# Patient Record
Sex: Male | Born: 1965
Health system: Southern US, Community
[De-identification: ages and names within clinical notes are randomized; demographics above are authoritative.]

## PROBLEM LIST (undated history)

## (undated) DIAGNOSIS — S43102S Unspecified dislocation of left acromioclavicular joint, sequela: Secondary | ICD-10-CM

## (undated) DIAGNOSIS — M549 Dorsalgia, unspecified: Secondary | ICD-10-CM

## (undated) DIAGNOSIS — I10 Essential (primary) hypertension: Secondary | ICD-10-CM

## (undated) HISTORY — DX: Essential (primary) hypertension: I10

## (undated) HISTORY — DX: Unspecified dislocation of left acromioclavicular joint, sequela: S43.102S

## (undated) HISTORY — DX: Dorsalgia, unspecified: M54.9

---

## 2003-02-22 ENCOUNTER — Emergency Department (HOSPITAL_COMMUNITY): Admission: EM | Admit: 2003-02-22 | Discharge: 2003-02-22 | Payer: Self-pay | Admitting: Emergency Medicine

## 2005-09-15 ENCOUNTER — Emergency Department (HOSPITAL_COMMUNITY): Admission: EM | Admit: 2005-09-15 | Discharge: 2005-09-15 | Payer: Self-pay | Admitting: Emergency Medicine

## 2006-02-23 ENCOUNTER — Emergency Department (HOSPITAL_COMMUNITY): Admission: EM | Admit: 2006-02-23 | Discharge: 2006-02-23 | Payer: Self-pay | Admitting: Emergency Medicine

## 2007-07-06 ENCOUNTER — Emergency Department (HOSPITAL_COMMUNITY): Admission: EM | Admit: 2007-07-06 | Discharge: 2007-07-06 | Payer: Self-pay | Admitting: Emergency Medicine

## 2007-11-09 ENCOUNTER — Emergency Department (HOSPITAL_COMMUNITY): Admission: EM | Admit: 2007-11-09 | Discharge: 2007-11-09 | Payer: Self-pay | Admitting: Emergency Medicine

## 2010-01-22 ENCOUNTER — Emergency Department (HOSPITAL_COMMUNITY): Admission: EM | Admit: 2010-01-22 | Discharge: 2010-01-22 | Payer: Self-pay | Admitting: Emergency Medicine

## 2010-10-28 LAB — CBC
HCT: 39.8 % (ref 39.0–52.0)
Hemoglobin: 13.6 g/dL (ref 13.0–17.0)
MCHC: 34.3 g/dL (ref 30.0–36.0)
MCV: 94.4 fL (ref 78.0–100.0)
Platelets: 227 10*3/uL (ref 150–400)
RBC: 4.21 MIL/uL — ABNORMAL LOW (ref 4.22–5.81)
RDW: 14 % (ref 11.5–15.5)
WBC: 7.9 10*3/uL (ref 4.0–10.5)

## 2010-10-28 LAB — URINALYSIS, ROUTINE W REFLEX MICROSCOPIC
Bilirubin Urine: NEGATIVE
Glucose, UA: NEGATIVE mg/dL
Hgb urine dipstick: NEGATIVE
Nitrite: NEGATIVE
Protein, ur: NEGATIVE mg/dL
Specific Gravity, Urine: >1.03 — ABNORMAL HIGH (ref 1.005–1.030)
Urobilinogen, UA: 0.2 mg/dL (ref 0.0–1.0)
pH: 5.5 (ref 5.0–8.0)

## 2010-10-28 LAB — COMPREHENSIVE METABOLIC PANEL
ALT: 24 U/L (ref 0–53)
AST: 21 U/L (ref 0–37)
Albumin: 4.1 g/dL (ref 3.5–5.2)
Alkaline Phosphatase: 60 U/L (ref 39–117)
BUN: 9 mg/dL (ref 6–23)
CO2: 28 mEq/L (ref 19–32)
Calcium: 9.1 mg/dL (ref 8.4–10.5)
Chloride: 101 mEq/L (ref 96–112)
Creatinine, Ser: 1.01 mg/dL (ref 0.4–1.5)
GFR calc Af Amer: >60 mL/min (ref >60)
GFR calc non Af Amer: >60 mL/min (ref >60)
Glucose, Bld: 80 mg/dL (ref 70–99)
Potassium: 3.9 mEq/L (ref 3.5–5.1)
Sodium: 139 mEq/L (ref 135–145)
Total Bilirubin: 0.4 mg/dL (ref 0.3–1.2)
Total Protein: 8 g/dL (ref 6.0–8.3)

## 2010-10-28 LAB — DIFFERENTIAL
Basophils Absolute: 0 10*3/uL (ref 0.0–0.1)
Basophils Relative: 0 % (ref 0–1)
Eosinophils Absolute: 0.1 10*3/uL (ref 0.0–0.7)
Eosinophils Relative: 1 % (ref 0–5)
Lymphocytes Relative: 26 % (ref 12–46)
Lymphs Abs: 2.1 10*3/uL (ref 0.7–4.0)
Monocytes Absolute: 0.5 10*3/uL (ref 0.1–1.0)
Monocytes Relative: 7 % (ref 3–12)
Neutro Abs: 5.2 10*3/uL (ref 1.7–7.7)
Neutrophils Relative %: 66 % (ref 43–77)

## 2011-05-20 LAB — I-STAT 8, (EC8 V) (CONVERTED LAB)
Acid-Base Excess: 2
BUN: 15
Bicarbonate: 27.3 — ABNORMAL HIGH
Chloride: 103
Glucose, Bld: 146 — ABNORMAL HIGH
HCT: 52
Hemoglobin: 17.7 — ABNORMAL HIGH
Operator id: 106841
Potassium: 3.7
Sodium: 136
TCO2: 29
pCO2, Ven: 43.5 — ABNORMAL LOW
pH, Ven: 7.406 — ABNORMAL HIGH

## 2011-11-24 ENCOUNTER — Encounter (HOSPITAL_COMMUNITY): Payer: Self-pay

## 2011-11-24 ENCOUNTER — Emergency Department (HOSPITAL_COMMUNITY)
Admission: EM | Admit: 2011-11-24 | Discharge: 2011-11-24 | Disposition: A | Discharge status: Home / Self Care | Payer: Worker's Compensation | Attending: Emergency Medicine | Admitting: Emergency Medicine

## 2011-11-24 ENCOUNTER — Emergency Department (HOSPITAL_COMMUNITY): Payer: Worker's Compensation

## 2011-11-24 DIAGNOSIS — G8929 Other chronic pain: Secondary | ICD-10-CM | POA: Insufficient documentation

## 2011-11-24 DIAGNOSIS — F17200 Nicotine dependence, unspecified, uncomplicated: Secondary | ICD-10-CM | POA: Insufficient documentation

## 2011-11-24 DIAGNOSIS — M25519 Pain in unspecified shoulder: Secondary | ICD-10-CM | POA: Insufficient documentation

## 2011-11-24 MED ORDER — HYDROCODONE-ACETAMINOPHEN 5-325 MG PO TABS: 1.0000 | ORAL_TABLET | ORAL | Status: AC | PRN

## 2011-11-24 MED ORDER — IBUPROFEN 800 MG PO TABS
800.0000 mg | ORAL_TABLET | Freq: Once | ORAL | Status: AC
  Administered 2011-11-24: 800 mg via ORAL
  Filled 2011-11-24: qty 1

## 2011-11-24 MED ORDER — IBUPROFEN 600 MG PO TABS: 600.0000 mg | ORAL_TABLET | Freq: Four times a day (QID) | ORAL | Status: AC | PRN

## 2011-11-24 MED ORDER — HYDROCODONE-ACETAMINOPHEN 5-325 MG PO TABS
1.0000 | ORAL_TABLET | Freq: Once | ORAL | Status: AC
  Administered 2011-11-24: 1 via ORAL
  Filled 2011-11-24: qty 1

## 2011-11-24 NOTE — Discharge Instructions (Signed)
Shoulder Pain The shoulder is a ball and socket joint. The muscles and tendons (rotator cuff) are what keep the shoulder in its joint and stable. This collection of muscles and tendons holds in the head (ball) of the humerus (upper arm bone) in the fossa (cup) of the scapula (shoulder blade). Today no reason was found for your shoulder pain. Often pain in the shoulder may be treated conservatively with temporary immobilization. For example, holding the shoulder in one place using a sling for rest. Physical therapy may be needed if problems continue. HOME CARE INSTRUCTIONS   Apply ice to the sore area for 15 to 20 minutes, 3 to 4 times per day for the first 2 days. Put the ice in a plastic bag. Place a towel between the bag of ice and your skin.   If you have or were given a shoulder sling and straps, do not remove for as long as directed by your caregiver or until you see a caregiver for a follow-up examination. If you need to remove it to shower or bathe, move your arm as little as possible.   Sleep on several pillows at night to lessen swelling and pain.   Only take over-the-counter or prescription medicines for pain, discomfort, or fever as directed by your caregiver.   Keep any follow-up appointments in order to avoid any type of permanent shoulder disability or chronic pain problems.  SEEK MEDICAL CARE IF:   Pain in your shoulder increases or new pain develops in your arm, hand, or fingers.   Your hand or fingers are colder than your other hand.   You do not obtain pain relief with the medications or your pain becomes worse.  SEEK IMMEDIATE MEDICAL CARE IF:   Your arm, hand, or fingers are numb or tingling.   Your arm, hand, or fingers are swollen, painful, or turn white or blue.   You develop chest pain or shortness of breath.  MAKE SURE YOU:   Understand these instructions.   Will watch your condition.   Will get help right away if you are not doing well or get worse.    Document Released: 05/07/2005 Document Revised: 07/17/2011 Document Reviewed: 07/12/2011 [facility] Patient Information 2012 [place], [place].   Use the medicines prescribed for inflammation and for pain.  Use caution with hydrocodone as this medication will make you drowsy, do not drive within 4 hours of taking this.  He may find a heating pad 20 minutes several times per day may help these your pain somewhat.  Please call Dr. Romeo Apple for further management of your chronic shoulder injury.

## 2011-11-24 NOTE — ED Notes (Signed)
MD at bedside. Gregory Marsh at bedside discussing plan of care.

## 2011-11-24 NOTE — ED Notes (Signed)
Pt states pain in left shoulder has been present since 2005 however has increased over past several days. States pain is unbearable at this time. Must "prop shoulder up well sitting for comfort per pt. radial Pulses equal bilateral.
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
## 2011-11-29 NOTE — ED Provider Notes (Signed)
Medical screening examination/treatment/procedure(s) were performed by non-physician practitioner and as supervising physician I was immediately available for consultation/collaboration.  Donnetta Hutching, MD 11/29/11 1558

## 2011-12-09 ENCOUNTER — Encounter: Payer: Self-pay | Admitting: Orthopedic Surgery

## 2011-12-09 ENCOUNTER — Ambulatory Visit (INDEPENDENT_AMBULATORY_CARE_PROVIDER_SITE_OTHER): Payer: Private Health Insurance - Indemnity | Admitting: Orthopedic Surgery

## 2011-12-09 VITALS — BP 110/70 | Ht 72.0 in | Wt 220.0 lb

## 2011-12-09 DIAGNOSIS — S43429A Sprain of unspecified rotator cuff capsule, initial encounter: Secondary | ICD-10-CM

## 2011-12-09 DIAGNOSIS — M25519 Pain in unspecified shoulder: Secondary | ICD-10-CM

## 2011-12-09 DIAGNOSIS — M75100 Unspecified rotator cuff tear or rupture of unspecified shoulder, not specified as traumatic: Secondary | ICD-10-CM

## 2011-12-09 DIAGNOSIS — S43102S Unspecified dislocation of left acromioclavicular joint, sequela: Secondary | ICD-10-CM

## 2011-12-09 NOTE — Patient Instructions (Addendum)
An MRI will be scheduled for you  Stay OOW x 2 weeks

## 2011-12-10 ENCOUNTER — Encounter: Payer: Self-pay | Admitting: Orthopedic Surgery

## 2011-12-10 DIAGNOSIS — S43102S Unspecified dislocation of left acromioclavicular joint, sequela: Secondary | ICD-10-CM

## 2011-12-10 DIAGNOSIS — M75100 Unspecified rotator cuff tear or rupture of unspecified shoulder, not specified as traumatic: Secondary | ICD-10-CM | POA: Insufficient documentation

## 2011-12-10 HISTORY — DX: Unspecified dislocation of left acromioclavicular joint, sequela: S43.102S
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
## 2011-12-16 ENCOUNTER — Telehealth: Payer: Self-pay | Admitting: Radiology

## 2011-12-16 NOTE — Telephone Encounter (Signed)
I called to give the patient his MRI appointment at [facility] on 12-18-11 at 12:45. Patient has Monia Pouch, authorization [phone] and it expires on 03-15-12. Patient will follow up back here for his results.

## 2011-12-17 ENCOUNTER — Other Ambulatory Visit: Payer: Self-pay | Admitting: Family Medicine

## 2011-12-18 ENCOUNTER — Ambulatory Visit (HOSPITAL_COMMUNITY)
Admission: RE | Admit: 2011-12-18 | Discharge: 2011-12-18 | Disposition: A | Discharge status: Home / Self Care | Payer: Private Health Insurance - Indemnity | Source: Ambulatory Visit | Attending: Orthopedic Surgery | Admitting: Orthopedic Surgery

## 2011-12-18 DIAGNOSIS — M249 Joint derangement, unspecified: Secondary | ICD-10-CM | POA: Insufficient documentation

## 2011-12-18 DIAGNOSIS — M719 Bursopathy, unspecified: Secondary | ICD-10-CM | POA: Insufficient documentation

## 2011-12-18 DIAGNOSIS — M75100 Unspecified rotator cuff tear or rupture of unspecified shoulder, not specified as traumatic: Secondary | ICD-10-CM

## 2011-12-18 DIAGNOSIS — M25519 Pain in unspecified shoulder: Secondary | ICD-10-CM | POA: Insufficient documentation

## 2011-12-18 DIAGNOSIS — M67919 Unspecified disorder of synovium and tendon, unspecified shoulder: Secondary | ICD-10-CM | POA: Insufficient documentation

## 2011-12-23 ENCOUNTER — Encounter: Payer: Self-pay | Admitting: Orthopedic Surgery

## 2011-12-23 ENCOUNTER — Ambulatory Visit (INDEPENDENT_AMBULATORY_CARE_PROVIDER_SITE_OTHER): Payer: Private Health Insurance - Indemnity | Admitting: Orthopedic Surgery

## 2011-12-23 VITALS — BP 100/70 | Ht 72.0 in | Wt 220.0 lb

## 2011-12-23 DIAGNOSIS — S43102S Unspecified dislocation of left acromioclavicular joint, sequela: Secondary | ICD-10-CM

## 2011-12-23 DIAGNOSIS — S43429A Sprain of unspecified rotator cuff capsule, initial encounter: Secondary | ICD-10-CM

## 2011-12-23 DIAGNOSIS — M75100 Unspecified rotator cuff tear or rupture of unspecified shoulder, not specified as traumatic: Secondary | ICD-10-CM

## 2011-12-23 NOTE — Patient Instructions (Signed)
You have been scheduled for surgery.  All surgeries carry some risk.  Remember you always have the option of continued nonsurgical treatment. However in this situation the risks vs. the benefits favor surgery as the best treatment option. The risks of the surgery includes the following but is not limited to bleeding, infection, pulmonary embolus, death from anesthesia, nerve injury vascular injury or need for further surgery, continued pain.  Specific to this procedure the following risks and complications are rare but possible Stiffness, pain, weakness, re\re tear.  I expect 9-12 months to fully recover from this procedure.    Rotator Cuff Tear The rotator cuff is four tendons that assist in the motion of the shoulder. A rotator cuff tear is a tear in one of these four tendons. It is characterized by pain and weakness of the shoulder. The rotator cuff tendons surround the shoulder ball and socket joint (humeral head). The rotator cuff tendons attach to the shoulder blade (scapula) on one side and the upper arm bone (humerus) on the other side. The rotator cuff is essential for shoulder stability and shoulder motion. SYMPTOMS    Pain around the shoulder, often at the outer portion of the upper arm.   Pain that is worse with shoulder function, especially when reaching overhead or lifting.   Weakness of the shoulder muscles.   Aching when not using your arm; often, pain awakens you at night, especially when sleeping on the affected side.   Tenderness, swelling, warmth, or redness over the outer aspect of the shoulder.   Loss of strength.   Limited motion of the shoulder, especially reaching behind (reaching into one's back pocket) or across your body.   A crackling sound (crepitation) when moving the shoulder.   Biceps tendon pain (in the front of the shoulder) and inflammation, worse with bending the elbow or lifting.  CAUSES    Strain from sudden increase in amount or intensity of  activity.   Direct blow or injury to the shoulder.   Aging, wear from from normal use.   Roof of the shoulder (acromial) spur.  RISK INCREASES WITH:    Contact sports (football, wrestling, or boxing).   Throwing or hitting sports (baseball, tennis, or volleyball).   Weightlifting and bodybuilding.   Heavy labor.   Previous injury to rotator cuff.   Failure to warm up properly before activity.   Inadequate protective equipment.   Increasing age.   Spurring of the outer end of the scapula (acromion).   Cortisone injections.   Poor shoulder strength and flexibility.  PREVENTION  Warm up and stretch properly before activity.   Allow time for rest and recovery between practices and competition.   Maintain physical fitness:   Cardiovascular fitness.   Shoulder flexibility.   Strength and endurance of the rotator cuff muscles and muscles of the shoulder blade.   Learn and use proper technique when throwing or hitting.  PROGNOSIS Surgery is often needed. Although, symptoms may go away by themselves. RELATED COMPLICATIONS    Persistent pain that may progress to constant pain.   Shoulder stiffness, frozen shoulder syndrome, or loss of motion.   Recurrence of symptoms, especially if treated without surgery.   Inability to return to same level of sports, even with surgery.   Persistent weakness.   Risks of surgery, including infection, bleeding, injury to nerves, shoulder stiffness, weakness, re-tearing of the rotator cuff tendon.   Deltoid detachment, acromial fracture, and persistent pain.  TREATMENT Treatment involves the use of  ice and medicine to reduce pain and inflammation. Strengthening and stretching exercise are usually recommended. These exercises may be completed at home or with a therapist. You may also be instructed to modify offending activities. Corticosteroid injections may be given to reduce inflammation. Surgery is usually recommended for  athletes. Surgery has the best chance for a full recovery. Surgery involves:  Removal of an inflamed bursa.   Removal of an acromial spur if present.   Suturing the torn tendon back together.  Rotator cuff surgeries may be preformed either arthroscopically or through an open incision. Recovery typically takes 6 to 12 months. MEDICATION  If pain medicine is necessary, then nonsteroidal anti-inflammatory medicines, such as aspirin and ibuprofen, or other minor pain relievers, such as acetaminophen, are often recommended.   Do not take pain medicine for 7 days before surgery.   Prescription pain relievers are usually only prescribed after surgery. Use only as directed and only as much as you need.   Corticosteroid injections may be given to reduce inflammation. However, there is a limited number of times the joint may be injected with these medicines.  HEAT AND COLD  Cold treatment (icing) relieves pain and reduces inflammation. Cold treatment should be applied for 10 to 15 minutes every 2 to 3 hours for inflammation and pain and immediately after any activity that aggravates your symptoms. Use ice packs or massage the area with a piece of ice (ice massage).   Heat treatment may be used prior to performing the stretching and strengthening activities prescribed by your caregiver, physical therapist, or athletic trainer. Use a heat pack or soak the injury in warm water.  SEEK MEDICAL CARE IF:    Symptoms get worse or do not improve in 4 to 6 weeks despite treatment.   You experience pain, numbness, or coldness in the hand.   Blue, gray, or dark color appears in the fingernails.   New, unexplained symptoms develop (drugs used in treatment may produce side effects).  Document Released: 07/28/2005 Document Revised: 07/17/2011 Document Reviewed: 11/09/2008 [facility] Patient Information 2012 [place], [place].
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
## 2011-12-26 ENCOUNTER — Telehealth: Payer: Self-pay | Admitting: Orthopedic Surgery

## 2011-12-26 NOTE — Telephone Encounter (Signed)
Contacted insurer [employer], ph# [phone], regarding out-patient surgery scheduled 01/09/12, CPT [phone].  Reached automated voice response system, which indicates no pre-certification for in-network providers for either of these procedure codes. REF# [id number], for [id number], REF # same for [id number].  A fax confirmation also received with this information.

## 2011-12-31 ENCOUNTER — Encounter (HOSPITAL_COMMUNITY): Payer: Self-pay | Admitting: Pharmacy Technician

## 2012-01-01 NOTE — Patient Instructions (Addendum)
20 Gregory Marsh  01/01/2012   Your procedure is scheduled on:  01/09/2012  Report to [facility] at  730  AM.  Call this number if you have problems the morning of surgery: [phone]   Remember:   Do not eat food:After Midnight.  May have clear liquids:until Midnight .    Take these medicines the morning of surgery with A SIP OF WATER:  Zyrtec,norco   Do not wear jewelry, make-up or nail polish.  Do not wear lotions, powders, or perfumes. You may wear deodorant.  Do not shave 48 hours prior to surgery. Men may shave face and neck.  Do not bring valuables to the hospital.  Contacts, dentures or bridgework may not be worn into surgery.  Leave suitcase in the car. After surgery it may be brought to your room.  For patients admitted to the hospital, checkout time is 11:00 AM the day of discharge.   Patients discharged the day of surgery will not be allowed to drive home.  Name and phone number of your driver: family  Special Instructions: CHG Shower Use Special Wash: 1/2 bottle night before surgery and 1/2 bottle morning of surgery.   Please read over the following fact sheets that you were given: Pain Booklet, MRSA Information, Surgical Site Infection Prevention, Anesthesia Post-op Instructions and Care and Recovery After Surgery Shoulder Arthroscopy Because the shoulder is one of the most mobile joints, it is more prone to injury. It is a very shallow ball and socket joint located between the large bone in your upper arm (humerus) and the shoulder blade (scapula). Arthroscopy is a valuable test for evaluating and treating injuries involving the shoulder joint. Arthroscopy is a surgical technique which uses small incisions (cuts by the surgeon) to insert a small telescope like instrument (arthroscope) and other tools into the shoulder. This allows the surgeon to look directly at the problem. When the arthroscope is in the joint, fluid is used to expand the joint space. This allows the surgeon to  examine it more easily. The arthroscope then beams light into the joint and sends an image to a TV screen. As your surgeon examines your shoulder, he or she can also repair a number of problems found at the same time. Sometimes the procedure may change to an open surgery. This would happen if the problems are severe enough that they cannot be corrected with just arthroscopy. This is usually a very safe surgery. Rare complications include damage to nerves or blood vessels, excess bleeding, blood clots, infection, and rarely instrument failure. This is most often performed as a same day surgery. This means you will not have to stay in the hospital overnight. Recovery from this surgery is also much faster than having an open procedure. LET YOUR CAREGIVER KNOW ABOUT:  Allergies.   Medications taken including herbs, eye drops, over the counter medications, and creams.   Use of steroids (by mouth or creams).   Previous problems with anesthetics or novocaine.   Possibility of pregnancy, if this applies.   History of blood clots (thrombophlebitis).   History of bleeding or blood problems.   Previous surgery.   Other health problems.   Family history of anesthetic problems.  BEFORE THE PROCEDURE   Stop all anti-inflammatory medications at least one week before surgery unless instructed otherwise. Tell your surgeon if you have been taking cortisone or other steroids.   Do not eat or drink after midnight or as instructed. Take medications as directed by your caregiver.  You may have lab tests the morning of surgery.   You should be present 60 minutes prior to your procedure or as directed.  PROCEDURE  You may have general (go to sleep) or local (numb the area) anesthetic. Your surgeon will discuss this with you. During the procedure as discussed above, your surgeon may find a variety of problems which he or she can improve or correct using small instruments. When the procedure is finished the tiny  incisions will be closed with stitches or tape. AFTER YOUR PROCEDURE  After surgery you will be taken to the recovery area. A nurse will watch and check your progress. Once you are awake, stable, and taking fluids well, barring other problems you will be allowed to go home.   Once home, apply an ice pack to your operative site for twenty minutes, three to four times per day, for two to three days. This may help with discomfort and keep the swelling down.   Use a sling and medications if prescribed or as instructed.   Unless your caregiver advises otherwise, move your arm and shoulder gently and frequently following the procedure. This can help prevent stiffness and swelling.  REHABILITATION  Almost as important as your surgery is your rehabilitation. If physical therapy and exercises are prescribed by your surgeon, follow them diligently. Once comfortable and on your way to full use, do muscle strengthening exercises as instructed.   Only take over-the-counter or prescription medicines for pain, discomfort, or fever as directed by your caregiver.  SEEK IMMEDIATE MEDICAL CARE IF:   There is redness, swelling, or increasing pain in the wound or joint.   You notice purulent (colored- pus-like) drainage coming from the wound.   An unexplained oral temperature above 102 F (38.9 C) develops.   You notice a foul smell coming from the wound or dressing.   There is a breaking open of the wound. The edges do not stay together after sutures or tape has been removed.   Persistent bleeding from the small incision.  Document Released: 07/25/2000 Document Revised: 07/17/2011 Document Reviewed: 11/13/2008 [facility] Patient Information 2012 [place], [place].PATIENT INSTRUCTIONS POST-ANESTHESIA  IMMEDIATELY FOLLOWING SURGERY:  Do not drive or operate machinery for the first twenty four hours after surgery.  Do not make any important decisions for twenty four hours after surgery or while taking narcotic  pain medications or sedatives.  If you develop intractable nausea and vomiting or a severe headache please notify your doctor immediately.  FOLLOW-UP:  Please make an appointment with your surgeon as instructed. You do not need to follow up with anesthesia unless specifically instructed to do so.  WOUND CARE INSTRUCTIONS (if applicable):  Keep a dry clean dressing on the anesthesia/puncture wound site if there is drainage.  Once the wound has quit draining you may leave it open to air.  Generally you should leave the bandage intact for twenty four hours unless there is drainage.  If the epidural site drains for more than 36-48 hours please call the anesthesia department.  QUESTIONS?:  Please feel free to call your physician or the hospital operator if you have any questions, and they will be happy to assist you.     [facility] Anesthesia Department [address] [place] [phone]

## 2012-01-02 ENCOUNTER — Encounter (HOSPITAL_COMMUNITY)
Admission: RE | Admit: 2012-01-02 | Discharge: 2012-01-02 | Payer: Private Health Insurance - Indemnity | Source: Ambulatory Visit

## 2012-01-07 ENCOUNTER — Encounter (HOSPITAL_COMMUNITY): Payer: Self-pay

## 2012-01-07 ENCOUNTER — Encounter (HOSPITAL_COMMUNITY)
Admission: RE | Admit: 2012-01-07 | Discharge: 2012-01-07 | Payer: Private Health Insurance - Indemnity | Source: Ambulatory Visit | Attending: Orthopedic Surgery | Admitting: Orthopedic Surgery

## 2012-01-07 LAB — BASIC METABOLIC PANEL
BUN: 13 mg/dL (ref 6–23)
CO2: 30 mEq/L (ref 19–32)
Calcium: 9.6 mg/dL (ref 8.4–10.5)
Chloride: 98 mEq/L (ref 96–112)
Creatinine, Ser: 1.21 mg/dL (ref 0.50–1.35)
GFR calc Af Amer: 81 mL/min — ABNORMAL LOW (ref >90)
GFR calc non Af Amer: 70 mL/min — ABNORMAL LOW (ref >90)
Glucose, Bld: 116 mg/dL — ABNORMAL HIGH (ref 70–99)
Potassium: 4.6 mEq/L (ref 3.5–5.1)
Sodium: 137 mEq/L (ref 135–145)

## 2012-01-07 LAB — SURGICAL PCR SCREEN
MRSA, PCR: NEGATIVE
Staphylococcus aureus: NEGATIVE

## 2012-01-07 LAB — HEMOGLOBIN AND HEMATOCRIT, BLOOD
HCT: 40.6 % (ref 39.0–52.0)
Hemoglobin: 13.9 g/dL (ref 13.0–17.0)
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
## 2012-01-09 ENCOUNTER — Ambulatory Visit (HOSPITAL_COMMUNITY)
Admission: RE | Admit: 2012-01-09 | Discharge: 2012-01-09 | Disposition: A | Discharge status: Home / Self Care | Payer: Private Health Insurance - Indemnity | Source: Ambulatory Visit | Attending: Orthopedic Surgery | Admitting: Orthopedic Surgery

## 2012-01-09 ENCOUNTER — Ambulatory Visit (HOSPITAL_COMMUNITY): Payer: Private Health Insurance - Indemnity | Admitting: Anesthesiology

## 2012-01-09 ENCOUNTER — Encounter (HOSPITAL_COMMUNITY)
Admission: RE | Disposition: A | Discharge status: Home / Self Care | Payer: Self-pay | Source: Ambulatory Visit | Attending: Orthopedic Surgery

## 2012-01-09 ENCOUNTER — Encounter (HOSPITAL_COMMUNITY): Payer: Self-pay | Admitting: Anesthesiology

## 2012-01-09 ENCOUNTER — Encounter (HOSPITAL_COMMUNITY): Payer: Self-pay

## 2012-01-09 DIAGNOSIS — Z9889 Other specified postprocedural states: Secondary | ICD-10-CM

## 2012-01-09 DIAGNOSIS — M24119 Other articular cartilage disorders, unspecified shoulder: Secondary | ICD-10-CM | POA: Diagnosis present

## 2012-01-09 DIAGNOSIS — M67919 Unspecified disorder of synovium and tendon, unspecified shoulder: Secondary | ICD-10-CM | POA: Insufficient documentation

## 2012-01-09 DIAGNOSIS — M719 Bursopathy, unspecified: Secondary | ICD-10-CM | POA: Insufficient documentation

## 2012-01-09 DIAGNOSIS — M249 Joint derangement, unspecified: Secondary | ICD-10-CM | POA: Insufficient documentation

## 2012-01-09 DIAGNOSIS — M19019 Primary osteoarthritis, unspecified shoulder: Secondary | ICD-10-CM | POA: Insufficient documentation

## 2012-01-09 DIAGNOSIS — S43102S Unspecified dislocation of left acromioclavicular joint, sequela: Secondary | ICD-10-CM

## 2012-01-09 HISTORY — PX: SHOULDER OPEN ROTATOR CUFF REPAIR: SHX2407

## 2012-01-09 HISTORY — PX: SHOULDER ARTHROSCOPY: SHX128

## 2012-01-09 SURGERY — REPAIR, ROTATOR CUFF, OPEN
Anesthesia: General | Site: Shoulder | Laterality: Left | Wound class: Clean

## 2012-01-09 MED ORDER — METHOCARBAMOL 500 MG PO TABS: 500.0000 mg | ORAL_TABLET | Freq: Four times a day (QID) | ORAL | Status: AC

## 2012-01-09 MED ORDER — CHLORHEXIDINE GLUCONATE 4 % EX LIQD
60.0000 mL | Freq: Once | CUTANEOUS | Status: DC
  Filled 2012-01-09: qty 60

## 2012-01-09 MED ORDER — GLYCOPYRROLATE 0.2 MG/ML IJ SOLN
0.2000 mg | Freq: Once | INTRAMUSCULAR | Status: AC
  Administered 2012-01-09: 0.2 mg via INTRAVENOUS

## 2012-01-09 MED ORDER — MIDAZOLAM HCL 5 MG/5ML IJ SOLN
INTRAMUSCULAR | Status: DC | PRN
  Administered 2012-01-09: 2 mg via INTRAVENOUS

## 2012-01-09 MED ORDER — PREGABALIN 50 MG PO CAPS
50.0000 mg | ORAL_CAPSULE | Freq: Three times a day (TID) | ORAL | Status: DC
  Administered 2012-01-09: 50 mg via ORAL

## 2012-01-09 MED ORDER — ONDANSETRON HCL 4 MG/2ML IJ SOLN
4.0000 mg | Freq: Once | INTRAMUSCULAR | Status: AC
  Administered 2012-01-09: 4 mg via INTRAVENOUS

## 2012-01-09 MED ORDER — NEOSTIGMINE METHYLSULFATE 1 MG/ML IJ SOLN
INTRAMUSCULAR | Status: DC | PRN
  Administered 2012-01-09: 4 mg via INTRAVENOUS

## 2012-01-09 MED ORDER — NEOSTIGMINE METHYLSULFATE 1 MG/ML IJ SOLN
INTRAMUSCULAR | Status: AC
  Filled 2012-01-09: qty 10

## 2012-01-09 MED ORDER — GLYCOPYRROLATE 0.2 MG/ML IJ SOLN
INTRAMUSCULAR | Status: AC
  Filled 2012-01-09: qty 3

## 2012-01-09 MED ORDER — ACETAMINOPHEN 10 MG/ML IV SOLN
1000.0000 mg | Freq: Once | INTRAVENOUS | Status: AC
  Administered 2012-01-09: 1000 mg via INTRAVENOUS

## 2012-01-09 MED ORDER — FENTANYL CITRATE 0.05 MG/ML IJ SOLN
INTRAMUSCULAR | Status: DC | PRN
  Administered 2012-01-09 (×2): 50 ug via INTRAVENOUS
  Administered 2012-01-09: 150 ug via INTRAVENOUS
  Administered 2012-01-09: 50 ug via INTRAVENOUS
  Administered 2012-01-09: 100 ug via INTRAVENOUS
  Administered 2012-01-09: 50 ug via INTRAVENOUS

## 2012-01-09 MED ORDER — GLYCOPYRROLATE 0.2 MG/ML IJ SOLN
INTRAMUSCULAR | Status: AC
  Administered 2012-01-09: 0.2 mg via INTRAVENOUS
  Filled 2012-01-09: qty 1

## 2012-01-09 MED ORDER — CEFAZOLIN SODIUM-DEXTROSE 2-3 GM-% IV SOLR
INTRAVENOUS | Status: AC
  Filled 2012-01-09: qty 50

## 2012-01-09 MED ORDER — ROCURONIUM BROMIDE 50 MG/5ML IV SOLN
INTRAVENOUS | Status: AC
  Filled 2012-01-09: qty 1

## 2012-01-09 MED ORDER — PREGABALIN 50 MG PO CAPS
ORAL_CAPSULE | ORAL | Status: AC
  Administered 2012-01-09: 50 mg via ORAL
  Filled 2012-01-09: qty 1

## 2012-01-09 MED ORDER — MIDAZOLAM HCL 2 MG/2ML IJ SOLN
INTRAMUSCULAR | Status: AC
  Filled 2012-01-09: qty 2

## 2012-01-09 MED ORDER — SODIUM CHLORIDE 0.9 % IR SOLN
Status: DC | PRN
  Administered 2012-01-09 (×5)

## 2012-01-09 MED ORDER — BUPIVACAINE-EPINEPHRINE 0.5% -1:200000 IJ SOLN
INTRAMUSCULAR | Status: DC | PRN
  Administered 2012-01-09: 85 mL

## 2012-01-09 MED ORDER — BUPIVACAINE-EPINEPHRINE PF 0.5-1:200000 % IJ SOLN
INTRAMUSCULAR | Status: AC
  Filled 2012-01-09: qty 20

## 2012-01-09 MED ORDER — FENTANYL CITRATE 0.05 MG/ML IJ SOLN
INTRAMUSCULAR | Status: AC
  Filled 2012-01-09: qty 2

## 2012-01-09 MED ORDER — MIDAZOLAM HCL 2 MG/2ML IJ SOLN
1.0000 mg | INTRAMUSCULAR | Status: DC | PRN
  Administered 2012-01-09 (×2): 2 mg via INTRAVENOUS

## 2012-01-09 MED ORDER — ONDANSETRON HCL 4 MG/2ML IJ SOLN
INTRAMUSCULAR | Status: AC
  Administered 2012-01-09: 4 mg via INTRAVENOUS
  Filled 2012-01-09: qty 2

## 2012-01-09 MED ORDER — ROCURONIUM BROMIDE 100 MG/10ML IV SOLN
INTRAVENOUS | Status: DC | PRN
  Administered 2012-01-09: 10 mg via INTRAVENOUS
  Administered 2012-01-09: 40 mg via INTRAVENOUS

## 2012-01-09 MED ORDER — OXYCODONE HCL 5 MG PO TABS
ORAL_TABLET | ORAL | Status: AC
  Administered 2012-01-09: 5 mg via ORAL
  Filled 2012-01-09: qty 1

## 2012-01-09 MED ORDER — LACTATED RINGERS IV SOLN
INTRAVENOUS | Status: DC | PRN
  Administered 2012-01-09 (×2): via INTRAVENOUS

## 2012-01-09 MED ORDER — LACTATED RINGERS IV SOLN
INTRAVENOUS | Status: DC
  Administered 2012-01-09: 09:00:00 via INTRAVENOUS

## 2012-01-09 MED ORDER — OXYCODONE HCL 5 MG PO TABS
5.0000 mg | ORAL_TABLET | ORAL | Status: DC
  Administered 2012-01-09: 5 mg via ORAL

## 2012-01-09 MED ORDER — ONDANSETRON HCL 4 MG/2ML IJ SOLN: 4.0000 mg | Freq: Once | INTRAMUSCULAR | Status: DC | PRN

## 2012-01-09 MED ORDER — FENTANYL CITRATE 0.05 MG/ML IJ SOLN: 25.0000 ug | INTRAMUSCULAR | Status: DC | PRN

## 2012-01-09 MED ORDER — CELECOXIB 100 MG PO CAPS
400.0000 mg | ORAL_CAPSULE | Freq: Once | ORAL | Status: AC
  Administered 2012-01-09: 400 mg via ORAL

## 2012-01-09 MED ORDER — CEFAZOLIN SODIUM-DEXTROSE 2-3 GM-% IV SOLR: 2.0000 g | INTRAVENOUS | Status: DC

## 2012-01-09 MED ORDER — PROMETHAZINE HCL 12.5 MG PO TABS: 12.5000 mg | ORAL_TABLET | Freq: Four times a day (QID) | ORAL | Status: DC | PRN

## 2012-01-09 MED ORDER — LIDOCAINE HCL (PF) 1 % IJ SOLN
INTRAMUSCULAR | Status: AC
  Filled 2012-01-09: qty 5

## 2012-01-09 MED ORDER — GLYCOPYRROLATE 0.2 MG/ML IJ SOLN
INTRAMUSCULAR | Status: DC | PRN
  Administered 2012-01-09: 0.6 mg via INTRAVENOUS

## 2012-01-09 MED ORDER — KETOROLAC TROMETHAMINE 30 MG/ML IJ SOLN
INTRAMUSCULAR | Status: AC
  Administered 2012-01-09: 30 mg via INTRAVENOUS
  Filled 2012-01-09: qty 1

## 2012-01-09 MED ORDER — ACETAMINOPHEN 10 MG/ML IV SOLN
INTRAVENOUS | Status: AC
  Administered 2012-01-09: 1000 mg via INTRAVENOUS
  Filled 2012-01-09: qty 100

## 2012-01-09 MED ORDER — KETOROLAC TROMETHAMINE 30 MG/ML IJ SOLN
30.0000 mg | Freq: Once | INTRAMUSCULAR | Status: AC
  Administered 2012-01-09: 30 mg via INTRAVENOUS

## 2012-01-09 MED ORDER — BUPIVACAINE-EPINEPHRINE PF 0.5-1:200000 % IJ SOLN
INTRAMUSCULAR | Status: AC
  Filled 2012-01-09: qty 10

## 2012-01-09 MED ORDER — PROPOFOL 10 MG/ML IV EMUL
INTRAVENOUS | Status: DC | PRN
  Administered 2012-01-09: 180 mg via INTRAVENOUS

## 2012-01-09 MED ORDER — SODIUM CHLORIDE 0.9 % IR SOLN
Status: DC | PRN
  Administered 2012-01-09: 1000 mL

## 2012-01-09 MED ORDER — PROPOFOL 10 MG/ML IV EMUL
INTRAVENOUS | Status: AC
  Filled 2012-01-09: qty 20

## 2012-01-09 MED ORDER — HEMOSTATIC AGENTS (NO CHARGE) OPTIME
TOPICAL | Status: DC | PRN
  Administered 2012-01-09: 1 via TOPICAL

## 2012-01-09 MED ORDER — LIDOCAINE HCL (CARDIAC) 10 MG/ML IV SOLN
INTRAVENOUS | Status: DC | PRN
  Administered 2012-01-09: 50 mg via INTRAVENOUS

## 2012-01-09 MED ORDER — CELECOXIB 100 MG PO CAPS
ORAL_CAPSULE | ORAL | Status: AC
  Administered 2012-01-09: 400 mg via ORAL
  Filled 2012-01-09: qty 4

## 2012-01-09 MED ORDER — FENTANYL CITRATE 0.05 MG/ML IJ SOLN
INTRAMUSCULAR | Status: AC
  Filled 2012-01-09: qty 5

## 2012-01-09 MED ORDER — MIDAZOLAM HCL 2 MG/2ML IJ SOLN
INTRAMUSCULAR | Status: AC
  Administered 2012-01-09: 2 mg via INTRAVENOUS
  Filled 2012-01-09: qty 2

## 2012-01-09 MED ORDER — EPINEPHRINE HCL 1 MG/ML IJ SOLN
INTRAMUSCULAR | Status: AC
  Filled 2012-01-09: qty 10

## 2012-01-09 MED ORDER — ACETAMINOPHEN 325 MG PO TABS: 325.0000 mg | ORAL_TABLET | ORAL | Status: DC | PRN

## 2012-01-09 MED ORDER — IBUPROFEN 800 MG PO TABS: 800.0000 mg | ORAL_TABLET | Freq: Three times a day (TID) | ORAL | Status: AC | PRN

## 2012-01-09 MED ORDER — OXYCODONE-ACETAMINOPHEN 5-325 MG PO TABS: 1.0000 | ORAL_TABLET | ORAL | Status: AC | PRN

## 2012-01-09 MED ORDER — CEFAZOLIN SODIUM 1-5 GM-% IV SOLN
INTRAVENOUS | Status: DC | PRN
  Administered 2012-01-09: 2 g via INTRAVENOUS

## 2012-01-09 MED ORDER — METHOCARBAMOL 100 MG/ML IJ SOLN
500.0000 mg | Freq: Once | INTRAVENOUS | Status: AC
  Administered 2012-01-09: 500 mg via INTRAVENOUS
  Filled 2012-01-09: qty 5

## 2012-01-09 NOTE — Op Note (Signed)
01/09/2012  11:33 AM  PATIENT:  Gregory Marsh  46 y.o. male  PRE-OPERATIVE DIAGNOSIS:  torn rotator cuff, symptomatic ac joint arthrosis left shoulder  POST-OPERATIVE DIAGNOSIS:   1. OA LEFT SHOULDER AC JOINT  2. ANTERIOR LABRAL TEAR   3. PARTIAL SUBSCAPULARIS TEAR   4. OS ACROMIALE  5. ROTATOR CUFF TENDONOSIS   PROCEDURE:  Procedure(s) (LRB): 1. LEFT SHOULDER ARTHROSCOPY DEBRIDEMENT LIMITED 16109 2. ARTHROSCOPIC ACROMIOPLASTY 29826 3. OPEN DISTAL CLAVICLE 23120  FINDINGS:  Degenerative tear anterior labrum  Partial tear subscapularis intra-articular  Os acromiale, fibrous union   Osteoarthritis acromioclavicular joint  Details the surgical procedure  The patient was identified in the preop holding and the left shoulder was identified as a surgical site marked the chart was reviewed and the consent was signed. The patient was taken back to the operating room he was given appropriate antibiotics IV for his the supine position and general intubation and then placed in the modified beachchair position the left lobe showed was prepped and draped sterilely. Timeout procedure was executed. A posterior portal was established the joint was entered. A diagnostic arthroscopy was performed. In the joint we saw that he had no evidence of a rotator cuff tear but he did have a degenerative anteriorly tear. It was felt that he was not symptomatic from this and was stable in abduction external rotation position and therefore this was gently debrided.  Further debridement was performed around the subscapularis.  The scope was then placed in the subacromial space a bursectomy was performed. He also had a fibrous nonunion of an os acromiale and this was taken down with a motorized bur including the deltoid attachment intact.  The acromioclavicular joint was stenotic with a large inferior impinging osteophyte on the rotator cuff which was diseased and felt to have severe tendinosis.  The scope was  removed from the joint and the a.c. joint was opened. This was done in the following manner. An incision was made in line with the a.c. joint subcutaneous tissue was divided down to the periosteum a transverse incision was made creating a periosteal sleeve with anterior posterior flaps the bulky distal clavicle with its large inferior osteophyte was removed and an oscillating saw and an osteotome. Hemostasis was obtained using cautery, Surgicel and bone wax. After thorough irrigation the periosteal sleeve was closed with 0 Monocryl in running fashion. Subcuticular stitch was placed using 2-0 Monocryl and skin was reapproximated with staples. The periosteum and subacromial space were injected with Marcaine and epinephrine solution followed by application of sterile dressings and a shoulder immobilizer    SURGEON:  Surgeon(s) and Role:    * Vickki Hearing, MD - Primary  PHYSICIAN ASSISTANT:   ASSISTANTS: nicole small    ANESTHESIA:   general  EBL:  Total I/O In: 1000 [I.V.:1000] Out: -   BLOOD ADMINISTERED:none  DRAINS: none   LOCAL MEDICATIONS USED:  MARCAINE   , Amount: 85 ml and OTHER + epi  SPECIMEN:  No Specimen  DISPOSITION OF SPECIMEN:  N/A  COUNTS:  YES  TOURNIQUET:  * No tourniquets in log *  DICTATION: .Dragon Dictation  PLAN OF CARE: Discharge to home after PACU  PATIENT DISPOSITION:  PACU - hemodynamically stable.   Delay start of Pharmacological VTE agent (>24hrs) due to surgical blood loss or risk of bleeding: not applicable

## 2012-01-09 NOTE — Anesthesia Postprocedure Evaluation (Signed)
  Anesthesia Post-op Note  Patient: Gregory Marsh  Procedure(s) Performed: Procedure(s) (LRB): ROTATOR CUFF REPAIR SHOULDER OPEN (Left) ARTHROSCOPY SHOULDER (Left) SHOULDER ACROMIOPLASTY (Left)  Patient Location: PACU  Anesthesia Type: General  Level of Consciousness: awake, alert , oriented and patient cooperative  Airway and Oxygen Therapy: Patient Spontanous Breathing and Patient connected to face mask oxygen  Post-op Pain: mild  Post-op Assessment: Post-op Vital signs reviewed, Patient's Cardiovascular Status Stable, Respiratory Function Stable, Patent Airway and No signs of Nausea or vomiting  Post-op Vital Signs: Reviewed and stable  Complications: No apparent anesthesia complications

## 2012-01-09 NOTE — Anesthesia Preprocedure Evaluation (Signed)
Anesthesia Evaluation  Patient identified by MRN, date of birth, ID band Patient awake    Reviewed: Allergy & Precautions, H&P , NPO status , Patient's Chart, lab work & pertinent test results  Airway Mallampati: I TM Distance: >3 FB Neck ROM: Full    Dental No notable dental hx.    Pulmonary neg pulmonary ROS,    Pulmonary exam normal       Cardiovascular negative cardio ROS  Rhythm:Regular Rate:Normal     Neuro/Psych  Neuromuscular disease negative psych ROS   GI/Hepatic negative GI ROS, Neg liver ROS,   Endo/Other  negative endocrine ROS  Renal/GU negative Renal ROS     Musculoskeletal negative musculoskeletal ROS (+)   Abdominal Normal abdominal exam  (+)   Peds  Hematology negative hematology ROS (+)   Anesthesia Other Findings   Reproductive/Obstetrics                           Anesthesia Physical Anesthesia Plan  ASA: I  Anesthesia Plan: General   Post-op Pain Management:    Induction: Intravenous  Airway Management Planned: Oral ETT  Additional Equipment:   Intra-op Plan:   Post-operative Plan: Extubation in OR  Informed Consent: I have reviewed the patients History and Physical, chart, labs and discussed the procedure including the risks, benefits and alternatives for the proposed anesthesia with the patient or authorized representative who has indicated his/her understanding and acceptance.     Plan Discussed with: CRNA  Anesthesia Plan Comments:         Anesthesia Quick Evaluation

## 2012-01-09 NOTE — Anesthesia Procedure Notes (Signed)
Procedure Name: Intubation Date/Time: 01/09/2012 9:34 AM Performed by: Carolyne Littles, Keyaria Lawson L Pre-anesthesia Checklist: Patient identified, Patient being monitored, Timeout performed, Emergency Drugs available and Suction available Patient Re-evaluated:Patient Re-evaluated prior to inductionOxygen Delivery Method: Circle System Utilized Preoxygenation: Pre-oxygenation with 100% oxygen Intubation Type: IV induction Ventilation: Mask ventilation without difficulty Laryngoscope Size: 3 and Miller Grade View: Grade I Tube type: Oral Tube size: 7.0 mm Number of attempts: 1 Airway Equipment and Method: stylet Placement Confirmation: ETT inserted through vocal cords under direct vision,  positive ETCO2 and breath sounds checked- equal and bilateral Secured at: 21 cm Tube secured with: Tape Dental Injury: Teeth and Oropharynx as per pre-operative assessment

## 2012-01-09 NOTE — Interval H&P Note (Signed)
History and Physical Interval Note:  01/09/2012 9:14 AM  Gregory Marsh  has presented today for surgery, with the diagnosis of torn rotator cuff, symptomatic ac joint arthrosis left shoulder  The various methods of treatment have been discussed with the patient and family. After consideration of risks, benefits and other options for treatment, the patient has consented to  Procedure(s) (LRB):LEFT SHOULDER ARTHROSCOPY WITH OPEN ROTATOR CUFF REPAIR AND DISTAL CLAVICLE ACROMINECTOMY (Left) as a surgical intervention .  The patients' history has been reviewed, patient examined, no change in status, stable for surgery.  I have reviewed the patients' chart and labs.  Questions were answered to the patient's satisfaction.     Fuller Canada

## 2012-01-09 NOTE — Transfer of Care (Signed)
Immediate Anesthesia Transfer of Care Note  Patient: Gregory Marsh  Procedure(s) Performed: Procedure(s) (LRB): SHOULDER ARTHROSCOPY WITH OPEN ROTATOR CUFF REPAIR AND DISTAL CLAVICLE ACROMINECTOMY (Left) ROTATOR CUFF REPAIR SHOULDER OPEN (Left) ARTHROSCOPY SHOULDER (Left)  Patient Location: PACU  Anesthesia Type: General  Level of Consciousness: awake, oriented and patient cooperative  Airway & Oxygen Therapy: Patient Spontanous Breathing and Patient connected to face mask oxygen  Post-op Assessment: Report given to PACU RN and Post -op Vital signs reviewed and stable  Post vital signs: Reviewed and stable  Complications: No apparent anesthesia complications

## 2012-01-09 NOTE — Preoperative (Signed)
Beta Blockers   Reason not to administer Beta Blockers:Not Applicable 

## 2012-01-09 NOTE — Brief Op Note (Addendum)
01/09/2012  11:33 AM  PATIENT:  Gregory Marsh  46 y.o. male  PRE-OPERATIVE DIAGNOSIS:  torn rotator cuff, symptomatic ac joint arthrosis left shoulder  POST-OPERATIVE DIAGNOSIS:   1. OA LEFT SHOULDER AC JOINT  2. ANTERIOR LABRAL TEAR   3. PARTIAL SUBSCAPULARIS TEAR   4. OS ACROMIALE  5. ROTATOR CUFF TENDONOSIS   PROCEDURE:  Procedure(s) (LRB): 1. LEFT SHOULDER ARTHROSCOPY DEBRIDEMENT LIMITED 84696 2. ARTHROSCOPIC ACROMIOPLASTY 29826 3. OPEN DISTAL CLAVICLE 23120  FINDINGS:  Degenerative tear anterior labrum  Partial tear subscapularis intra-articular  Os acromiale, fibrous union   Osteoarthritis acromioclavicular joint  SURGEON:  Surgeon(s) and Role:    * Vickki Hearing, MD - Primary  PHYSICIAN ASSISTANT:   ASSISTANTS: nicole small    ANESTHESIA:   general  EBL:  Total I/O In: 1000 [I.V.:1000] Out: -   BLOOD ADMINISTERED:none  DRAINS: none   LOCAL MEDICATIONS USED:  MARCAINE   , Amount: 85 ml and OTHER + epi  SPECIMEN:  No Specimen  DISPOSITION OF SPECIMEN:  N/A  COUNTS:  YES  TOURNIQUET:  * No tourniquets in log *  DICTATION: .Dragon Dictation  PLAN OF CARE: Discharge to home after PACU  PATIENT DISPOSITION:  PACU - hemodynamically stable.   Delay start of Pharmacological VTE agent (>24hrs) due to surgical blood loss or risk of bleeding: not applicable

## 2012-01-12 ENCOUNTER — Ambulatory Visit (INDEPENDENT_AMBULATORY_CARE_PROVIDER_SITE_OTHER): Payer: Private Health Insurance - Indemnity | Admitting: Orthopedic Surgery

## 2012-01-12 ENCOUNTER — Encounter: Payer: Self-pay | Admitting: Orthopedic Surgery

## 2012-01-12 VITALS — BP 140/70 | Ht 72.0 in | Wt 220.0 lb

## 2012-01-12 DIAGNOSIS — Z9889 Other specified postprocedural states: Secondary | ICD-10-CM | POA: Insufficient documentation

## 2012-01-12 NOTE — Patient Instructions (Addendum)
Shoulder brace x  3 weeks  Start Physical therapy in 1 week

## 2012-01-12 NOTE — Progress Notes (Signed)
Patient ID: Gregory Marsh, male   DOB: 02-24-1966, 46 y.o.   MRN: [id number] Chief Complaint  Patient presents with  . Routine Post Op    post op 1 left shoulder, DOS 01/09/12    BP 140/70  Ht 6' (1.829 m)  Wt 220 lb (99.791 kg)  BMI 29.84 kg/m2  PRE-OPERATIVE DIAGNOSIS: torn rotator cuff, symptomatic ac joint arthrosis left shoulder  POST-OPERATIVE DIAGNOSIS:  1. OA LEFT SHOULDER AC JOINT  2. ANTERIOR LABRAL TEAR  3. PARTIAL SUBSCAPULARIS TEAR  4. OS ACROMIALE  5. ROTATOR CUFF TENDONOSIS  PROCEDURE: Procedure(s) (LRB):  1. LEFT SHOULDER ARTHROSCOPY DEBRIDEMENT LIMITED 78295  2. ARTHROSCOPIC ACROMIOPLASTY 29826  3. OPEN DISTAL CLAVICLE 23120  FINDINGS:  Degenerative tear anterior labrum  Partial tear subscapularis intra-articular  Os acromiale, fibrous union  Osteoarthritis acromioclavicular joint  Postop visit #1 postop day #3.  His incisions look clean his neurovascular exam is normal he is placed in a sling shot and can start therapy in a week, remove staples in a week

## 2012-01-13 ENCOUNTER — Encounter (HOSPITAL_COMMUNITY): Payer: Self-pay | Admitting: Orthopedic Surgery

## 2012-01-13 DIAGNOSIS — M19019 Primary osteoarthritis, unspecified shoulder: Secondary | ICD-10-CM | POA: Diagnosis present

## 2012-01-13 DIAGNOSIS — M719 Bursopathy, unspecified: Secondary | ICD-10-CM | POA: Diagnosis present

## 2012-01-13 DIAGNOSIS — M67919 Unspecified disorder of synovium and tendon, unspecified shoulder: Secondary | ICD-10-CM | POA: Diagnosis present

## 2012-01-13 DIAGNOSIS — M24119 Other articular cartilage disorders, unspecified shoulder: Secondary | ICD-10-CM | POA: Diagnosis present

## 2012-01-13 HISTORY — DX: Other articular cartilage disorders, unspecified shoulder: M24.119

## 2012-01-13 HISTORY — DX: Primary osteoarthritis, unspecified shoulder: M19.019

## 2012-01-20 ENCOUNTER — Encounter: Payer: Self-pay | Admitting: Orthopedic Surgery

## 2012-01-20 ENCOUNTER — Ambulatory Visit (INDEPENDENT_AMBULATORY_CARE_PROVIDER_SITE_OTHER): Payer: Private Health Insurance - Indemnity | Admitting: Orthopedic Surgery

## 2012-01-20 ENCOUNTER — Telehealth: Payer: Self-pay | Admitting: Orthopedic Surgery

## 2012-01-20 VITALS — BP 126/82 | Ht 72.0 in | Wt 220.0 lb

## 2012-01-20 DIAGNOSIS — S43109A Unspecified dislocation of unspecified acromioclavicular joint, initial encounter: Secondary | ICD-10-CM

## 2012-01-20 DIAGNOSIS — M24119 Other articular cartilage disorders, unspecified shoulder: Secondary | ICD-10-CM

## 2012-01-20 NOTE — Patient Instructions (Signed)
He has not started please call for therapy department to arrange her 1st physical therapy visits.  Use sling when up in public otherwise, it can be removed

## 2012-01-20 NOTE — Progress Notes (Signed)
Patient ID: Gregory Marsh, male   DOB: 1966/07/27, 46 y.o.   MRN: [id number] Chief Complaint  Patient presents with  . Routine Post Op    post op 2, remove staples Left shoulder, DOS 01/09/12    BP 126/82  Ht 6' (1.829 m)  Wt 220 lb (99.791 kg)  BMI 29.84 kg/m2   would check suture removal. Today, from 8 arthroscopic decompression and open distal clavicle excision, LEFT shoulder. His pain is much better. His swelling has gone down. He can start therapy this week and use his sling when out of public otherwise, he can take it off.  Followup 4 weeks.

## 2012-01-20 NOTE — Telephone Encounter (Signed)
Updated medical office notes,work note faxed to [employer] # [phone], Ph #[phone], per patient and disability insurance request, to attention: Armandina Stammer; authorization on file.

## 2012-01-22 ENCOUNTER — Inpatient Hospital Stay (HOSPITAL_COMMUNITY)
Admission: RE | Admit: 2012-01-22 | Payer: Private Health Insurance - Indemnity | Source: Ambulatory Visit | Admitting: Specialist

## 2012-01-26 ENCOUNTER — Ambulatory Visit (HOSPITAL_COMMUNITY)
Admission: RE | Admit: 2012-01-26 | Discharge: 2012-01-26 | Disposition: A | Discharge status: Home / Self Care | Payer: Private Health Insurance - Indemnity | Source: Ambulatory Visit | Attending: Orthopedic Surgery | Admitting: Orthopedic Surgery

## 2012-01-26 DIAGNOSIS — M25519 Pain in unspecified shoulder: Secondary | ICD-10-CM | POA: Insufficient documentation

## 2012-01-26 DIAGNOSIS — IMO0001 Reserved for inherently not codable concepts without codable children: Secondary | ICD-10-CM | POA: Insufficient documentation

## 2012-01-26 DIAGNOSIS — M25619 Stiffness of unspecified shoulder, not elsewhere classified: Secondary | ICD-10-CM | POA: Insufficient documentation

## 2012-01-26 DIAGNOSIS — M6281 Muscle weakness (generalized): Secondary | ICD-10-CM | POA: Insufficient documentation

## 2012-01-26 NOTE — Evaluation (Signed)
Occupational Therapy Evaluation  Patient Details  Name: Gregory Marsh MRN: [id number] Date of Birth: Jan 09, 1966  Today's Date: 01/26/2012 Time: 1030-1120 OT Time Calculation (min): 50 min OT Evaluation 1030-1045 15' Manual Therapy 1045-1100 15' Ice 1105-1120 15' Visit#: 1  of 36   Re-eval: 02/23/12  Assessment Diagnosis: S/P Left SAD and Arthroscopic Acromioplasty Surgical Date: 01/09/12 Next MD Visit: 0709/13 Prior Therapy: N/A  Past Medical History: No past medical history on file. Past Surgical History:  Past Surgical History  Procedure Date  . Shoulder open rotator cuff repair 01/09/2012    Procedure: ROTATOR CUFF REPAIR SHOULDER OPEN;  Surgeon: Vickki Hearing, MD;  Location: [facility];  Service: Orthopedics;  Laterality: Left;  with distal clavicle excision  . Shoulder arthroscopy 01/09/2012    Procedure: ARTHROSCOPY SHOULDER;  Surgeon: Vickki Hearing, MD;  Location: [facility];  Service: Orthopedics;  Laterality: Left;  with debridement    Subjective S:  I hurt my left shoulder at work.  My goal is to get back 100%. Pertinent History: [name] is employed by [employer].  He injured his left shoulder at work, and has had a left distal clavicle excision, arthroscopic acromioplasty on 01/09/12.  He has been referred to occupational therapy for evaluation and treatment, following rotator cuff syndrome type protocol ( PROM x 6 weeks).  Upon review of surgery report,  Degenerative tear anterior labrum, and partial subscapularis tear were repaired. Special Tests: UEFI:  7/80: 8% independence level using LUE with daily tasks Patient Stated Goals: I want to get my left arm back to 100%. Pain Assessment Currently in Pain?: Yes Pain Score: 10-Worst pain ever Pain Location: Shoulder Pain Orientation: Left;Anterior Pain Type: Acute pain Pain Onset: 1 to 4 weeks ago Pain Frequency: Constant Effect of Pain on Daily Activities: During our evaluation, [name] was observed gesturing  with his arms quite often while talking.  I recommended that he use his sling more often, to avoid over use and potential damage of the surgical repairs that were done to his left arm.  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  Job requires heavy lifting, pushing, pulling, reaching overhead. Leisure: Hobbies-yes (Comment) Comments: [name] enjoys hunting and fishing  Assessment ADL/Vision/Perception ADL ADL Comments: [name] is not able to utilize his LUE with any activities above waist height, per protocol. Dominant Hand: Right Vision - History Baseline Vision: No visual deficits Perception Perception: Within Functional Limits Praxis Praxis: Intact  Cognition/Observation Cognition Orientation Level: Oriented X4 Observation/Other Assessments Observations: Moderate fascial restrictions and tenderness in left  upper arm, scapular, and shoulder region. Other Assessments: UEFI 7/80= 87%  Sensation/Coordination/Edema Sensation Light Touch: Appears Intact Coordination Gross Motor Movements are Fluid and Coordinated: Yes Fine Motor Movements are Fluid and Coordinated: Yes Edema Edema: n/a  Additional Assessments LUE PROM (degrees) LUE Overall PROM Comments: Assessed in supine, ER/IR with shoulder adducted.  Strength not tested due to protocol. Left Shoulder Flexion: 85 Degrees Left Shoulder ABduction: 70 Degrees Left Shoulder Internal Rotation: 75 Degrees Left Shoulder External Rotation: 8 Degrees Palpation Palpation: moderate fascial restrictions noted in left upper arm, scapular, and shoulder region.     Exercise/Treatments    Modalities Modalities: Cryotherapy Manual  Therapy Manual Therapy: Myofascial release Myofascial Release: MFR and  manual stretching to left upper arm, scapular, and shoulder region to decrease pain and restrictions and increase pain free PROM within parameters of protocol.  1045--1100 Cryotherapy Number Minutes Cryotherapy: 10 Minutes Cryotherapy Location: Shoulder Type of Cryotherapy: Ice pack  Occupational Therapy Assessment and Plan OT Assessment and Plan Clinical Impression Statement: A:  46 year old male presents s/p surgical repair to left subscapularis.  Patient has decreased A/PROM and strength and increased pain and fascial restrictions causing decreased I with all B/IADLs, work, and leisure activities. Pt will benefit from skilled therapeutic intervention in order to improve on the following deficits: Decreased activity tolerance;Decreased strength;Decreased knowledge of precautions;Increased fascial restricitons;Increased muscle spasms;Pain;Impaired UE functional use Rehab Potential: Excellent OT Frequency: Min 3X/week OT Duration: Other (comment) (12 weeks) OT Treatment/Interventions: Therapeutic exercise;Manual therapy;Therapeutic activities;Self-care/ADL training;Patient/family education;Modalities OT Plan: P:  Skilled OT intervention to decrease pain and restrictions and increase A/PROM  and strength needed to return to full duties at work and home.  Treatment Plan:  MFR and manual stretching within parameters of protocol .  supine PROM, isometric strengthening, bridging.  seated elev, ext, row.  ball stretches.     Goals Short Term Goals Time to Complete Short Term Goals: 6 weeks Short Term Goal 1: Patient will be educated on HEP. Short Term Goal 2: Patient will increase left shoulder PROM to [facility] for increased ability to reach overhead at work. 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 Short Term Goal 4: Patient will decrease pain to 5/10 in  his left shoulder when reaching to shoulder height at work. Short Term Goal 5: Patient will decrease fascial restrictions from moderate to min-mod in his left shoulder. Long Term Goals Time to Complete Long Term Goals: 12 weeks Long Term Goal 1: Patient will return to prior level of I with all B/IADLs, work, and leisure activities. Long Term Goal 2: Patient will increase left shoulder AROM to WNL for increased ability to complete job tasks that involve reaching overhead. Long Term Goal 3: Patient will increase his left shoulder strength to 5/5 for increased ability to push and pull equipment at work. Long Term Goal 4: Patient will decrease pain to 2/10 or less in his left shoulder while completing work tasks. Long Term Goal 5: Patient will decrease fascial restrictions to trace in his left shoulder region.  Problem List Patient Active Problem List  Diagnosis  . Rotator cuff tear  . AC separation, type 3, left, sequela  . S/P arthroscopy of shoulder  . Articular cartilage disorder, shoulder region  . Disorders of bursae and tendons in shoulder region, unspecified  . Primary localized osteoarthrosis, shoulder region  . Pain in joint, shoulder region  . Muscle weakness (generalized)    End of Session Activity Tolerance: Patient tolerated treatment well General Behavior During Session: [facility] for tasks performed Cognition: [facility] for tasks performed OT Plan of Care OT Home Exercise Plan: Educated patient on towel slides, pendulum exercises, and AROM for elbow, forearm, wrist.  Written and verbal instructions given.   OT Patient Instructions: Increased use of sling. Consulted and Agree with Plan of Care: Patient    Shirlean Mylar, OTR/L  01/26/2012, 1:24 PM  Physician Documentation Your signature is required to indicate approval of the treatment plan as stated above.  Please sign and either send electronically or make a copy of this report for your files and return this physician  signed original.  Please mark  one 1.__approve of plan  2. ___approve of plan with the following conditions.   ______________________________                                                          _____________________ Physician Signature                                                                                                             Date

## 2012-01-28 ENCOUNTER — Ambulatory Visit (HOSPITAL_COMMUNITY)
Admission: RE | Admit: 2012-01-28 | Discharge: 2012-01-28 | Disposition: A | Discharge status: Home / Self Care | Payer: Private Health Insurance - Indemnity | Source: Ambulatory Visit | Attending: Pulmonary Disease | Admitting: Pulmonary Disease

## 2012-01-28 DIAGNOSIS — M25519 Pain in unspecified shoulder: Secondary | ICD-10-CM

## 2012-01-28 DIAGNOSIS — M6281 Muscle weakness (generalized): Secondary | ICD-10-CM

## 2012-01-28 NOTE — Progress Notes (Signed)
Occupational Therapy Treatment Patient Details  Name: EGON DITTUS MRN: [id number] Date of Birth: 13-Jun-1966  Today's Date: 01/28/2012 Time: 4098-1191 OT Time Calculation (min): 52 min Manual Therapy 1025-1051 26' Therapeutic Exercises 1051-1105 14' Ice 1107-1117 10' Visit#: 2  of 36   Re-eval: 02/23/12    Subjective Symptoms/Limitations Symptoms: S:  I tried the exercises and they are ok. Pain Assessment Pain Score:   9 Pain Location: Shoulder Pain Orientation: Left;Anterior Pain Type: Acute pain Pain Frequency: Intermittent  Precautions/Restrictions   PROM x 6 weeks  Exercise/Treatments Supine Protraction: PROM;5 reps External Rotation: PROM;10 reps Internal Rotation: PROM;10 reps Flexion: PROM;10 reps ABduction: PROM;10 reps Other Supine Exercises: bridging x 20 Seated Elevation: AROM;10 reps Extension: AROM;10 reps Row: AROM;10 reps Therapy Ball Flexion: 20 reps ABduction: 20 reps Isometric Strengthening   Flexion: Supine;3X3" Extension: Supine;3X3" External Rotation: Supine;3X3" Internal Rotation: Supine;3X3" ABduction: Supine;3X3" ADduction: Supine;3X3"    Manual Therapy Manual Therapy: Myofascial release Myofascial Release: MFR and manual stretching to left upper arm, scapular, and shoulder region to decrease pain and restrictions and increase pain free PROM withing parameters of protocol.  [id number] Cryotherapy Number Minutes Cryotherapy: 10 Minutes Cryotherapy Location: Shoulder Type of Cryotherapy: Ice pack  Occupational Therapy Assessment and Plan OT Assessment and Plan Clinical Impression Statement: A:  Patient continues to be quite guarded with PROM of left shoulder, however, PROM did increase to approximately 80 flexion, 90 abd, 15 ext rot this date in supine.  Required vg throughout PROM to relax his arm and allow therapist to move his arm. OT Plan: P:  Continue to increase PROM to [facility] within constraints of protocol.  Increase reps and  length of time isometric contractions are held.   Goals Short Term Goals Time to Complete Short Term Goals: 6 weeks Short Term Goal 1: Patient will be educated on HEP. Short Term Goal 1 Progress: Progressing toward goal Short Term Goal 2: Patient will increase left shoulder PROM to [facility] for increased ability to reach overhead at work. Short Term Goal 2 Progress: Progressing toward goal Short Term Goal 3: Patient will increase left shoulder strength to 3+/5 for increased ability to cast his fishing rod. Short Term Goal 3 Progress: Progressing toward goal Short Term Goal 4: Patient will decrease pain to 5/10 in his left shoulder when reaching to shoulder height at work. Short Term Goal 4 Progress: Progressing toward goal Short Term Goal 5: Patient will decrease fascial restrictions from moderate to min-mod in his left shoulder. Short Term Goal 5 Progress: Progressing toward goal Long Term Goals Time to Complete Long Term Goals: 12 weeks Long Term Goal 1: Patient will return to prior level of I with all B/IADLs, work, and leisure activities. Long Term Goal 1 Progress: Progressing toward goal Long Term Goal 2: Patient will increase left shoulder AROM to WNL for increased ability to complete job tasks that involve reaching overhead. Long Term Goal 2 Progress: Progressing toward goal Long Term Goal 3: Patient will increase his left shoulder strength to 5/5 for increased ability to push and pull equipment at work. Long Term Goal 3 Progress: Progressing toward goal Long Term Goal 4: Patient will decrease pain to 2/10 or less in his left shoulder while completing work tasks. Long Term Goal 4 Progress: Progressing toward goal Long Term Goal 5: Patient will decrease fascial restrictions to trace in his left shoulder region. Long Term Goal 5 Progress: Progressing toward goal  Problem List Patient Active Problem List  Diagnosis  . Rotator cuff tear  .  AC separation, type 3, left, sequela  . S/P  arthroscopy of shoulder  . Articular cartilage disorder, shoulder region  . Disorders of bursae and tendons in shoulder region, unspecified  . Primary localized osteoarthrosis, shoulder region  . Pain in joint, shoulder region  . Muscle weakness (generalized)    End of Session Activity Tolerance: Patient tolerated treatment well General Behavior During Session: [facility] for tasks performed Cognition: [facility] for tasks performed  GO No functional reporting required  Shirlean Mylar, OTR/L  01/28/2012, 11:25 AM

## 2012-01-29 ENCOUNTER — Ambulatory Visit (HOSPITAL_COMMUNITY)
Admission: RE | Admit: 2012-01-29 | Discharge: 2012-01-29 | Disposition: A | Discharge status: Home / Self Care | Payer: Private Health Insurance - Indemnity | Source: Ambulatory Visit | Attending: Orthopedic Surgery | Admitting: Orthopedic Surgery

## 2012-01-29 DIAGNOSIS — M25519 Pain in unspecified shoulder: Secondary | ICD-10-CM

## 2012-01-29 DIAGNOSIS — M6281 Muscle weakness (generalized): Secondary | ICD-10-CM

## 2012-01-29 NOTE — Progress Notes (Signed)
Occupational Therapy Treatment Patient Details  Name: Gregory Marsh MRN: [id number] Date of Birth: [birth year]  Today's Date: 01/29/2012 Time: 4098-1191 OT Time Calculation (min): 53 min Manual Therapy [phone] 21' Therapeutic Exercises 1011-1031 20' Ice [phone] 10' Visit#: 3  of 36   Re-eval: 02/23/12    Subjective Symptoms/Limitations Symptoms: S:  My shoulder was a little sore yesterday. Pain Assessment Currently in Pain?: Yes Pain Score:   8 Pain Orientation: Left;Anterior Pain Type: Acute pain  Precautions/Restrictions   PROM only x 6 weeks  Exercise/Treatments Supine Protraction: PROM;10 reps External Rotation: PROM;10 reps Internal Rotation: PROM;10 reps Flexion: PROM;10 reps ABduction: PROM;10 reps Other Supine Exercises: bridging x 20 Seated Elevation: AROM;10 reps Extension: AROM;10 reps Row: AROM;10 reps Therapy Ball Flexion: 20 reps ABduction: 20 reps ROM / Strengthening / Isometric Strengthening   Flexion: Supine;3X5" Extension: Supine;3X5" External Rotation: Supine;3X5" Internal Rotation: Supine;3X5" ABduction: Supine;3X5" ADduction: Supine;3X5"      Manual Therapy Manual Therapy: Myofascial release Myofascial Release: MFR and manual stretching to left upper arm, scapular, and shoulder region to decrease pain and restrictions and increase pain free PROM within parameters of protocol.  [phone] Cryotherapy Number Minutes Cryotherapy: 10 Minutes at the conclusion of treatment session. Cryotherapy Location: Shoulder Type of Cryotherapy: Ice pack  Occupational Therapy Assessment and Plan OT Assessment and Plan Clinical Impression Statement: A:  Increase in PROM again today.  Required min vg and tactile cuing to depress bilateral shoulders during ball flexion exercise. OT Plan: P:  Continue to increase PROM to [facility] within constraints of protocol.  Begin shoulder glides.     Goals Short Term Goals Time to Complete Short Term Goals: 6 weeks Short  Term Goal 1: Patient will be educated on HEP. Short Term Goal 1 Progress: Progressing toward goal Short Term Goal 2: Patient will increase left shoulder PROM to [facility] for increased ability to reach overhead at work. Short Term Goal 2 Progress: Progressing toward goal Short Term Goal 3: Patient will increase left shoulder strength to 3+/5 for increased ability to cast his fishing rod. Short Term Goal 3 Progress: Progressing toward goal Short Term Goal 4: Patient will decrease pain to 5/10 in his left shoulder when reaching to shoulder height at work. Short Term Goal 4 Progress: Progressing toward goal Short Term Goal 5: Patient will decrease fascial restrictions from moderate to min-mod in his left shoulder. Short Term Goal 5 Progress: Progressing toward goal Long Term Goals Time to Complete Long Term Goals: 12 weeks Long Term Goal 1: Patient will return to prior level of I with all B/IADLs, work, and leisure activities. Long Term Goal 1 Progress: Progressing toward goal Long Term Goal 2: Patient will increase left shoulder AROM to WNL for increased ability to complete job tasks that involve reaching overhead. Long Term Goal 2 Progress: Progressing toward goal Long Term Goal 3: Patient will increase his left shoulder strength to 5/5 for increased ability to push and pull equipment at work. Long Term Goal 3 Progress: Progressing toward goal Long Term Goal 4: Patient will decrease pain to 2/10 or less in his left shoulder while completing work tasks. Long Term Goal 4 Progress: Progressing toward goal Long Term Goal 5: Patient will decrease fascial restrictions to trace in his left shoulder region. Long Term Goal 5 Progress: Progressing toward goal  Problem List Patient Active Problem List  Diagnosis  . Rotator cuff tear  . AC separation, type 3, left, sequela  . S/P arthroscopy of shoulder  . Articular cartilage disorder,  shoulder region  . Disorders of bursae and tendons in shoulder region,  unspecified  . Primary localized osteoarthrosis, shoulder region  . Pain in joint, shoulder region  . Muscle weakness (generalized)    End of Session Activity Tolerance: Patient tolerated treatment well General Behavior During Session: [facility] for tasks performed Cognition: [facility] for tasks performed  GO No functional reporting required  Shirlean Mylar, OTR/L  01/29/2012, 10:41 AM

## 2012-02-02 ENCOUNTER — Ambulatory Visit (HOSPITAL_COMMUNITY)
Admission: RE | Admit: 2012-02-02 | Discharge: 2012-02-02 | Disposition: A | Discharge status: Home / Self Care | Payer: Private Health Insurance - Indemnity | Source: Ambulatory Visit | Attending: Orthopedic Surgery | Admitting: Orthopedic Surgery

## 2012-02-02 DIAGNOSIS — M25519 Pain in unspecified shoulder: Secondary | ICD-10-CM

## 2012-02-02 DIAGNOSIS — M6281 Muscle weakness (generalized): Secondary | ICD-10-CM

## 2012-02-02 NOTE — Progress Notes (Signed)
Occupational Therapy Treatment Patient Details  Name: Gregory Marsh MRN: [id number] Date of Birth: [birth year]  Today's Date: 02/02/2012 Time: 6962-9528 OT Time Calculation (min): 50 min Manual Therapy 4132-4401 20' Therapeutic Exercises [phone] 30' Visit#: 4  of 36   Re-eval: 02/23/12    Subjective Symptoms/Limitations Symptoms: S:  Its doing much better. Pain Assessment Currently in Pain?: Yes Pain Score:   7 Pain Location: Shoulder Pain Orientation: Left;Anterior Pain Type: Acute pain  Precautions/Restrictions   PROM x 6 weeks  Exercise/Treatments Supine Protraction: PROM;10 reps External Rotation: PROM;10 reps Internal Rotation: PROM;10 reps Flexion: PROM;10 reps ABduction: PROM;10 reps Other Supine Exercises: bridging x 25 Seated Elevation: AROM;15 reps Extension: AROM;15 reps Row: AROM;15 reps Therapy Ball Flexion: 20 reps ABduction: 20 reps ROM / Strengthening / Isometric Strengthening Anterior Glide: 3 x 5" Caudal Glide: 3 x 5" Flexion: Supine;5X5" Extension: Supine;5X5" External Rotation: Supine;5X5" Internal Rotation: Supine;5X5" ABduction: Supine;5X5" ADduction: Supine;5X5"    Manual Therapy Manual Therapy: Myofascial release Myofascial Release: MFR and manual stretching to left upper arm, scapular, and shoulder region to decrease pain and restrictions and increase pain free PROM within parameters of protocol.  [id number]  Occupational Therapy Assessment and Plan OT Assessment and Plan Clinical Impression Statement: A:  Considerably less tightness and guarding in shoulder during PROM this date.  Added caudal and anterior glide. OT Plan: P:  Increase reps with seated exercises, continue to increase PROM within constraints of protocol.   Goals Short Term Goals Time to Complete Short Term Goals: 6 weeks Short Term Goal 1: Patient will be educated on HEP. Short Term Goal 1 Progress: Progressing toward goal Short Term Goal 2: Patient will increase  left shoulder PROM to [facility] for increased ability to reach overhead at work. Short Term Goal 2 Progress: Progressing toward goal Short Term Goal 3: Patient will increase left shoulder strength to 3+/5 for increased ability to cast his fishing rod. Short Term Goal 3 Progress: Progressing toward goal Short Term Goal 4: Patient will decrease pain to 5/10 in his left shoulder when reaching to shoulder height at work. Short Term Goal 4 Progress: Progressing toward goal Short Term Goal 5: Patient will decrease fascial restrictions from moderate to min-mod in his left shoulder. Short Term Goal 5 Progress: Progressing toward goal Long Term Goals Time to Complete Long Term Goals: 12 weeks Long Term Goal 1: Patient will return to prior level of I with all B/IADLs, work, and leisure activities. Long Term Goal 1 Progress: Progressing toward goal Long Term Goal 2: Patient will increase left shoulder AROM to WNL for increased ability to complete job tasks that involve reaching overhead. Long Term Goal 2 Progress: Progressing toward goal Long Term Goal 3: Patient will increase his left shoulder strength to 5/5 for increased ability to push and pull equipment at work. Long Term Goal 3 Progress: Progressing toward goal Long Term Goal 4: Patient will decrease pain to 2/10 or less in his left shoulder while completing work tasks. Long Term Goal 4 Progress: Progressing toward goal Long Term Goal 5: Patient will decrease fascial restrictions to trace in his left shoulder region. Long Term Goal 5 Progress: Progressing toward goal  Problem List Patient Active Problem List  Diagnosis  . Rotator cuff tear  . AC separation, type 3, left, sequela  . S/P arthroscopy of shoulder  . Articular cartilage disorder, shoulder region  . Disorders of bursae and tendons in shoulder region, unspecified  . Primary localized osteoarthrosis, shoulder region  . Pain in  joint, shoulder region  . Muscle weakness (generalized)     End of Session Activity Tolerance: Patient tolerated treatment well General Behavior During Session: [facility] for tasks performed Cognition: [facility] for tasks performed  GO No functional reporting required  Shirlean Mylar, OTR/L  02/02/2012, 1:53 PM

## 2012-02-04 ENCOUNTER — Ambulatory Visit (HOSPITAL_COMMUNITY)
Admission: RE | Admit: 2012-02-04 | Discharge: 2012-02-04 | Disposition: A | Discharge status: Home / Self Care | Payer: Private Health Insurance - Indemnity | Source: Ambulatory Visit | Attending: Orthopedic Surgery | Admitting: Orthopedic Surgery

## 2012-02-04 DIAGNOSIS — M25519 Pain in unspecified shoulder: Secondary | ICD-10-CM

## 2012-02-04 DIAGNOSIS — M6281 Muscle weakness (generalized): Secondary | ICD-10-CM

## 2012-02-04 NOTE — Progress Notes (Signed)
Occupational Therapy Treatment Patient Details  Name: Gregory Marsh MRN: [id number] Date of Birth: [birth year]  Today's Date: 02/04/2012 Time: 1012-1059 OT Time Calculation (min): 47 min Manual Therapy [id number] 24' Therapeutic Exercise 0865-7846 22'  Visit#: 5  of 36   Re-eval: 02/23/12 Assessment Diagnosis: S/P Left SAD and Arthroscopic Acromioplasty Surgical Date: 01/09/12 Next MD Visit: 0709/13  Subjective Symptoms/Limitations Symptoms: S: It is getting better but it hurts. Pain Assessment Currently in Pain?: Yes  Precautions/Restrictions  Precautions Precautions: Shoulder Type of Shoulder Precautions: PROM x 6 weeks 02/20/12  Exercise/Treatments Supine Protraction: PROM;10 reps External Rotation: PROM;10 reps Internal Rotation: PROM;10 reps Flexion: PROM;10 reps ABduction: PROM;10 reps Other Supine Exercises: bridging x 25 Seated Elevation: AROM;15 reps Extension: AROM;15 reps Row: AROM;15 reps Therapy Ball Flexion: 20 reps ABduction: 20 reps ROM / Strengthening / Isometric Strengthening Anterior Glide: 5x5" Caudal Glide: 5x5" Flexion: Supine;5X5" Extension: Supine;5X5" External Rotation: Supine;5X5" Internal Rotation: Supine;5X5" ABduction: Supine;5X5" ADduction: Supine;5X5"       Manual Therapy Manual Therapy: Myofascial release Myofascial Release: MFR and manual stretching to left upper arm, scapular, and shoulder region to decrease pain and restrictions and increase pain free PROM within parameters of protocol  Occupational Therapy Assessment and Plan OT Assessment and Plan Clinical Impression Statement: A:  Little to no guarding with PROM today.  Some tightness in upperarm causing increased pain.  Pain decreased to 5/10 after manual. OT Plan: P:  Continue with plan following protocol.   Goals Short Term Goals Time to Complete Short Term Goals: 6 weeks Short Term Goal 1: Patient will be educated on HEP. Short Term Goal 2: Patient will increase  left shoulder PROM to [facility] for increased ability to reach overhead at work. 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 Short Term Goal 4: Patient will decrease pain to 5/10 in his left shoulder when reaching to shoulder height at work. Short Term Goal 5: Patient will decrease fascial restrictions from moderate to min-mod in his left shoulder. Long Term Goals Time to Complete Long Term Goals: 12 weeks Long Term Goal 1: Patient will return to prior level of I with all B/IADLs, work, and leisure activities. Long Term Goal 2: Patient will increase left shoulder AROM to WNL for increased ability to complete job tasks that involve reaching overhead. Long Term Goal 3: Patient will increase his left shoulder strength to 5/5 for increased ability to push and pull equipment at work. Long Term Goal 4: Patient will decrease pain to 2/10 or less in his left shoulder while completing work tasks. Long Term Goal 5: Patient will decrease fascial restrictions to trace in his left shoulder region.  Problem List Patient Active Problem List  Diagnosis  . Rotator cuff tear  . AC separation, type 3, left, sequela  . S/P arthroscopy of shoulder  . Articular cartilage disorder, shoulder region  . Disorders of bursae and tendons in shoulder region, unspecified  . Primary localized osteoarthrosis, shoulder region  . Pain in joint, shoulder region  . Muscle weakness (generalized)    End of Session Activity Tolerance: Patient tolerated treatment well General Behavior During Session: [facility] for tasks performed Cognition: [facility] for tasks performed  GO     Blakelynn Scheeler L. Navea Woodrow, COTA/L  02/04/2012, 11:01 AM

## 2012-02-06 ENCOUNTER — Ambulatory Visit (HOSPITAL_COMMUNITY)
Admission: RE | Admit: 2012-02-06 | Discharge: 2012-02-06 | Disposition: A | Discharge status: Home / Self Care | Payer: Private Health Insurance - Indemnity | Source: Ambulatory Visit | Attending: Pulmonary Disease | Admitting: Pulmonary Disease

## 2012-02-06 DIAGNOSIS — M25519 Pain in unspecified shoulder: Secondary | ICD-10-CM

## 2012-02-06 DIAGNOSIS — M6281 Muscle weakness (generalized): Secondary | ICD-10-CM

## 2012-02-06 NOTE — Progress Notes (Addendum)
Occupational Therapy Treatment Patient Details  Name: Gregory Marsh MRN: [id number] Date of Birth: August 02, 1966  Today's Date: 02/06/2012 Time: 1324-4010 OT Time Calculation (min): 43 min Manual Therapy 1022-1050 28' Therapeutic Exercise 1051-1105 14'   Visit#: 6  of 36   Re-eval: 02/23/12 Assessment Diagnosis: S/P Left SAD and Arthroscopic Acromioplasty Surgical Date: 01/09/12 Next MD Visit: 0709/13   Subjective Symptoms/Limitations Symptoms: S:  I didn't sleep too good last night. Pain Assessment Currently in Pain?: Yes Pain Score:   7 Pain Location: Shoulder Pain Orientation: Left;Anterior Pain Type: Acute pain  Precautions/Restrictions  Precautions Precautions: Shoulder Type of Shoulder Precautions: PROM x 6 weeks 02/20/12  Exercise/Treatments Supine Protraction: PROM;10 reps External Rotation: PROM;10 reps Internal Rotation: PROM;10 reps Flexion: PROM;10 reps ABduction: PROM;10 reps Other Supine Exercises: bridging x 25 Seated Elevation: AROM;15 reps Extension: AROM;15 reps Row: AROM;15 reps Therapy Ball Flexion: 20 reps ABduction: 20 reps ROM / Strengthening / Isometric Strengthening Anterior Glide: 5x5" Caudal Glide: 5x5" Flexion: Supine;5X5" Extension: Supine;5X5" External Rotation: Supine;5X5" Internal Rotation: Supine;5X5" ABduction: Supine;5X5" ADduction: Supine;5X5"            Manual Therapy Manual Therapy: Myofascial release Myofascial Release: MFR and manual stretching to left upper arm, scapular, and shoulder region to decrease pain and restrictions and increase pain free PROM within parameters of protocol   Occupational Therapy Assessment and Plan OT Assessment and Plan Clinical Impression Statement: A:  Increased PROM and ease with PROM.  Pain decreased to 6/10 after treatment. Pt. states he keeps a low dull ache most of the time.  Encouraged the use of heat or ice at home to help with pain control. OT Plan: P:  Continue with plan  following protocol.   Goals Short Term Goals Time to Complete Short Term Goals: 6 weeks Short Term Goal 1: Patient will be educated on HEP. Short Term Goal 2: Patient will increase left shoulder PROM to [facility] for increased ability to reach overhead at work. 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 Short Term Goal 4: Patient will decrease pain to 5/10 in his left shoulder when reaching to shoulder height at work. Short Term Goal 5: Patient will decrease fascial restrictions from moderate to min-mod in his left shoulder. Long Term Goals Time to Complete Long Term Goals: 12 weeks Long Term Goal 1: Patient will return to prior level of I with all B/IADLs, work, and leisure activities. Long Term Goal 2: Patient will increase left shoulder AROM to WNL for increased ability to complete job tasks that involve reaching overhead. Long Term Goal 3: Patient will increase his left shoulder strength to 5/5 for increased ability to push and pull equipment at work. Long Term Goal 4: Patient will decrease pain to 2/10 or less in his left shoulder while completing work tasks. Long Term Goal 5: Patient will decrease fascial restrictions to trace in his left shoulder region.  Problem List Patient Active Problem List  Diagnosis  . Rotator cuff tear  . AC separation, type 3, left, sequela  . S/P arthroscopy of shoulder  . Articular cartilage disorder, shoulder region  . Disorders of bursae and tendons in shoulder region, unspecified  . Primary localized osteoarthrosis, shoulder region  . Pain in joint, shoulder region  . Muscle weakness (generalized)    End of Session Activity Tolerance: Patient tolerated treatment well General Behavior During Session: [facility] for tasks performed Cognition: [facility] for tasks performed  GO  Rosell Khouri L. Rene Gonsoulin, COTA/L  02/06/2012, 11:08 AM

## 2012-02-09 ENCOUNTER — Ambulatory Visit (HOSPITAL_COMMUNITY)
Admission: RE | Admit: 2012-02-09 | Discharge: 2012-02-09 | Disposition: A | Discharge status: Home / Self Care | Payer: Private Health Insurance - Indemnity | Source: Ambulatory Visit | Attending: Pulmonary Disease | Admitting: Pulmonary Disease

## 2012-02-09 DIAGNOSIS — M25619 Stiffness of unspecified shoulder, not elsewhere classified: Secondary | ICD-10-CM | POA: Insufficient documentation

## 2012-02-09 DIAGNOSIS — IMO0001 Reserved for inherently not codable concepts without codable children: Secondary | ICD-10-CM | POA: Insufficient documentation

## 2012-02-09 DIAGNOSIS — M6281 Muscle weakness (generalized): Secondary | ICD-10-CM

## 2012-02-09 DIAGNOSIS — M25519 Pain in unspecified shoulder: Secondary | ICD-10-CM | POA: Insufficient documentation

## 2012-02-09 NOTE — Progress Notes (Signed)
Occupational Therapy Treatment Patient Details  Name: TACOMA MERIDA MRN: [id number] Date of Birth: 10-25-1965  Today's Date: 02/09/2012 Time: 4098-1191    Visit#: 7  of 36   Re-eval: 02/16/12 Assessment Diagnosis: S/P Left SAD and Arthroscopic Acromioplasty Surgical Date: 01/09/12 Next MD Visit: 0709/13  Subjective Symptoms/Limitations Symptoms: S:  I slept better Pain Assessment Currently in Pain?: Yes Pain Score:   4 Pain Location: Shoulder Pain Orientation: Left;Anterior Pain Type: Acute pain  Precautions/Restrictions  Precautions Precautions: Shoulder Type of Shoulder Precautions: PROM x 6 weeks 02/20/12  Exercise/Treatments Supine Protraction: PROM;10 reps External Rotation: PROM;10 reps Internal Rotation: PROM;10 reps Flexion: PROM;10 reps ABduction: PROM;10 reps Other Supine Exercises: bridging x 25 Seated Elevation: AROM;15 reps Extension: AROM;15 reps Row: AROM;15 reps Therapy Ball Flexion: 20 reps ABduction: 20 reps ROM / Strengthening / Isometric Strengthening Anterior Glide: 5x5" Caudal Glide: 5x5" Flexion: Supine;5X5" Extension: Supine;5X5" External Rotation: Supine;5X5" Internal Rotation: Supine;5X5" ABduction: Supine;5X5" ADduction: Supine;5X5"         Manual Therapy Manual Therapy: Myofascial release Myofascial Release: MFR and manual stretching to left upper arm, scapular, and shoulder region to decrease pain and restrictions and increase pain free PROM within parameters of protocol t  Occupational Therapy Assessment and Plan OT Assessment and Plan Clinical Impression Statement: A;  Began scar massage today .  Incision healed and streri strips off. Rehab Potential: Excellent OT Plan: P:  Continue to decrease pain and restrictions to allow for increase PROM   Goals Short Term Goals Time to Complete Short Term Goals: 6 weeks Short Term Goal 1: Patient will be educated on HEP. Short Term Goal 2: Patient will increase left shoulder  PROM to [facility] for increased ability to reach overhead at work. 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 Short Term Goal 4: Patient will decrease pain to 5/10 in his left shoulder when reaching to shoulder height at work. Short Term Goal 5: Patient will decrease fascial restrictions from moderate to min-mod in his left shoulder. Long Term Goals Time to Complete Long Term Goals: 12 weeks Long Term Goal 1: Patient will return to prior level of I with all B/IADLs, work, and leisure activities. Long Term Goal 2: Patient will increase left shoulder AROM to WNL for increased ability to complete job tasks that involve reaching overhead. Long Term Goal 3: Patient will increase his left shoulder strength to 5/5 for increased ability to push and pull equipment at work. Long Term Goal 4: Patient will decrease pain to 2/10 or less in his left shoulder while completing work tasks. Long Term Goal 5: Patient will decrease fascial restrictions to trace in his left shoulder region.  Problem List Patient Active Problem List  Diagnosis  . Rotator cuff tear  . AC separation, type 3, left, sequela  . S/P arthroscopy of shoulder  . Articular cartilage disorder, shoulder region  . Disorders of bursae and tendons in shoulder region, unspecified  . Primary localized osteoarthrosis, shoulder region  . Pain in joint, shoulder region  . Muscle weakness (generalized)    End of Session Activity Tolerance: Patient tolerated treatment well General Behavior During Session: [facility] for tasks performed Cognition: [facility] for tasks performed  GO     Saheed Carrington L. Breyonna Nault, COTA/L  02/09/2012, 1:29 PM

## 2012-02-11 ENCOUNTER — Ambulatory Visit (HOSPITAL_COMMUNITY)
Admission: RE | Admit: 2012-02-11 | Discharge: 2012-02-11 | Disposition: A | Discharge status: Home / Self Care | Payer: Private Health Insurance - Indemnity | Source: Ambulatory Visit | Attending: Pulmonary Disease | Admitting: Pulmonary Disease

## 2012-02-11 DIAGNOSIS — M6281 Muscle weakness (generalized): Secondary | ICD-10-CM

## 2012-02-11 DIAGNOSIS — M25519 Pain in unspecified shoulder: Secondary | ICD-10-CM

## 2012-02-11 NOTE — Progress Notes (Signed)
Occupational Therapy Treatment Patient Details  Name: Gregory Marsh MRN: [id number] Date of Birth: 03-Nov-1965  Today's Date: 02/11/2012 Time: 4098-1191 OT Time Calculation (min): 42 min Manual Therapy [phone] 18' Therapeutic Exercise 1105-1128 23'  Visit#: 8  of 36   Re-eval: 02/16/12 Assessment Diagnosis: S/P Left SAD and Arthroscopic Acromioplasty Surgical Date: 01/09/12 Next MD Visit: 0709/13   Subjective Symptoms/Limitations Symptoms: S:  Sorry I am late, I was having my car worked on.  Precautions/Restrictions  Precautions Precautions: Shoulder Type of Shoulder Precautions: PROM x 6 weeks 02/20/12  Exercise/Treatments Supine Protraction: PROM;10 reps External Rotation: PROM;10 reps Internal Rotation: PROM;10 reps Flexion: PROM;10 reps ABduction: PROM;10 reps Other Supine Exercises: bridging x 25 Seated Elevation: AROM;15 reps Extension: AROM;15 reps Row: AROM;15 reps Therapy Ball Flexion: 20 reps ABduction: 20 reps ROM / Strengthening / Isometric Strengthening Anterior Glide: 5x5" Caudal Glide: 5x5" Flexion: Supine;5X5" Extension: Supine;5X5" External Rotation: Supine;5X5" Internal Rotation: Supine;5X5" ABduction: Supine;5X5" ADduction: Supine;5X5"       Manual Therapy Manual Therapy: Myofascial release Myofascial Release: MFR and manual stretching to left upper arm, scapular, and shoulder region to decrease pain and restrictions and increase pain free PROM within parameters of protocol   Occupational Therapy Assessment and Plan OT Assessment and Plan Clinical Impression Statement: A:  Decreased restrictions felt today, increased PROM today. OT Plan: P:  Continue to increase PROM within protocol limits.   Goals Short Term Goals Time to Complete Short Term Goals: 6 weeks Short Term Goal 1: Patient will be educated on HEP. Short Term Goal 2: Patient will increase left shoulder PROM to Gregory Marsh for increased ability to reach overhead at work. Short Term  Goal 3: Patient will increase left shoulder strength to 3+/5 for increased ability to cast his fishing rod. Short Term Goal 4: Patient will decrease pain to 5/10 in his left shoulder when reaching to shoulder height at work. Short Term Goal 5: Patient will decrease fascial restrictions from moderate to min-mod in his left shoulder. Long Term Goals Time to Complete Long Term Goals: 12 weeks Long Term Goal 1: Patient will return to prior level of I with all B/IADLs, work, and leisure activities. Long Term Goal 2: Patient will increase left shoulder AROM to WNL for increased ability to complete job tasks that involve reaching overhead. Long Term Goal 3: Patient will increase his left shoulder strength to 5/5 for increased ability to push and pull equipment at work. Long Term Goal 4: Patient will decrease pain to 2/10 or less in his left shoulder while completing work tasks. Long Term Goal 5: Patient will decrease fascial restrictions to trace in his left shoulder region.  Problem List Patient Active Problem List  Diagnosis  . Rotator cuff tear  . AC separation, type 3, left, sequela  . S/P arthroscopy of shoulder  . Articular cartilage disorder, shoulder region  . Disorders of bursae and tendons in shoulder region, unspecified  . Primary localized osteoarthrosis, shoulder region  . Pain in joint, shoulder region  . Muscle weakness (generalized)    End of Session Activity Tolerance: Patient tolerated treatment well General Behavior During Session: Gregory Marsh for tasks performed Cognition: [facility] for tasks performed  GO    Gregory Marsh L. Suhaylah Wampole, COTA/L  02/11/2012, 11:38 AM

## 2012-02-13 ENCOUNTER — Ambulatory Visit (HOSPITAL_COMMUNITY)
Admission: RE | Admit: 2012-02-13 | Discharge: 2012-02-13 | Disposition: A | Discharge status: Home / Self Care | Payer: Private Health Insurance - Indemnity | Source: Ambulatory Visit | Attending: Pulmonary Disease | Admitting: Pulmonary Disease

## 2012-02-13 DIAGNOSIS — M25519 Pain in unspecified shoulder: Secondary | ICD-10-CM

## 2012-02-13 DIAGNOSIS — M6281 Muscle weakness (generalized): Secondary | ICD-10-CM

## 2012-02-13 NOTE — Progress Notes (Signed)
Occupational Therapy Treatment Patient Details  Name: Gregory Marsh MRN: [id number] Date of Birth: [birth year]  Today's Date: 02/13/2012 Time: 1010-1103 OT Time Calculation (min): 53 min Manual Therapy 1010-1038 28' Therapeutic Exercises [phone] 24' Visit#: 9  of 36   Re-eval: 02/16/12    Subjective Symptoms/Limitations Symptoms: S:  I can definitely feel all that you are doing, but it helps; it makes it feel looser. Pain Assessment Currently in Pain?: Yes Pain Score:   4 Pain Location: Shoulder Pain Orientation: Left Pain Type: Acute pain  Precautions/Restrictions   PROM through 02/20/12  Exercise/Treatments Supine Protraction: PROM;10 reps Horizontal ABduction: PROM;10 reps External Rotation: PROM;10 reps Internal Rotation: PROM;10 reps Flexion: PROM;10 reps ABduction: PROM;10 reps Other Supine Exercises: bridging x 25 Seated Elevation: AROM;15 reps Extension: AROM;15 reps Row: AROM;15 reps Other Seated Exercises: elbow flexion, extension, pronation, supination, wrist flexion, extension 15 reps with 1# Therapy Ball Flexion: 25 reps ABduction: 25 reps ROM / Strengthening / Isometric Strengthening Anterior Glide: 15" x 2 Caudal Glide: 15" x 2 Flexion: Supine (15" x 2) Extension: Supine (15" x 2) External Rotation: Supine (15" x 2) Internal Rotation: Supine (15" x 2) ABduction: Supine (15" x 2) ADduction: Supine (15" x 2)    Manual Therapy Manual Therapy: Myofascial release Myofascial Release: MFR and manual stretching to left upper arm, scapular, and shoulder region to decrease pain and restrictions and increase pain free PROM within parameters of protocol.  Seated cross hand release along supraspinatus and levator scapulae.  [phone]  Occupational Therapy Assessment and Plan OT Assessment and Plan Clinical Impression Statement: A:  Able to add horizontal abduction PROM for the first time this date, as pain level and restrictions have decreased.  Added elbow  - wrist strengthening with 1# resistance. OT Plan: P:  Reassess for MD visit. Increase PROM to [facility].  Increase to 30" x 2 with isometric strengthening exercises.   Goals Short Term Goals Time to Complete Short Term Goals: 6 weeks Short Term Goal 1: Patient will be educated on HEP. Short Term Goal 1 Progress: Progressing toward goal Short Term Goal 2: Patient will increase left shoulder PROM to [facility] for increased ability to reach overhead at work. Short Term Goal 2 Progress: Progressing toward goal Short Term Goal 3: Patient will increase left shoulder strength to 3+/5 for increased ability to cast his fishing rod. Short Term Goal 3 Progress: Progressing toward goal Short Term Goal 4: Patient will decrease pain to 5/10 in his left shoulder when reaching to shoulder height at work. Short Term Goal 4 Progress: Progressing toward goal Short Term Goal 5: Patient will decrease fascial restrictions from moderate to min-mod in his left shoulder. Short Term Goal 5 Progress: Progressing toward goal Long Term Goals Time to Complete Long Term Goals: 12 weeks Long Term Goal 1: Patient will return to prior level of I with all B/IADLs, work, and leisure activities. Long Term Goal 1 Progress: Progressing toward goal Long Term Goal 2: Patient will increase left shoulder AROM to WNL for increased ability to complete job tasks that involve reaching overhead. Long Term Goal 2 Progress: Progressing toward goal Long Term Goal 3: Patient will increase his left shoulder strength to 5/5 for increased ability to push and pull equipment at work. Long Term Goal 3 Progress: Progressing toward goal Long Term Goal 4: Patient will decrease pain to 2/10 or less in his left shoulder while completing work tasks. Long Term Goal 4 Progress: Progressing toward goal Long Term Goal 5: Patient will  decrease fascial restrictions to trace in his left shoulder region. Long Term Goal 5 Progress: Progressing toward goal  Problem  List Patient Active Problem List  Diagnosis  . Rotator cuff tear  . AC separation, type 3, left, sequela  . S/P arthroscopy of shoulder  . Articular cartilage disorder, shoulder region  . Disorders of bursae and tendons in shoulder region, unspecified  . Primary localized osteoarthrosis, shoulder region  . Pain in joint, shoulder region  . Muscle weakness (generalized)    End of Session Activity Tolerance: Patient tolerated treatment well General Behavior During Session: [facility] for tasks performed Cognition: [facility] for tasks performed  GO    Shirlean Mylar, OTR/L  02/13/2012, 11:51 AM

## 2012-02-16 ENCOUNTER — Ambulatory Visit (HOSPITAL_COMMUNITY)
Admission: RE | Admit: 2012-02-16 | Discharge: 2012-02-16 | Disposition: A | Discharge status: Home / Self Care | Payer: Private Health Insurance - Indemnity | Source: Ambulatory Visit | Attending: Pulmonary Disease | Admitting: Pulmonary Disease

## 2012-02-16 DIAGNOSIS — M6281 Muscle weakness (generalized): Secondary | ICD-10-CM

## 2012-02-16 DIAGNOSIS — M25519 Pain in unspecified shoulder: Secondary | ICD-10-CM

## 2012-02-16 NOTE — Progress Notes (Signed)
Occupational Therapy Treatment Patient Details  Name: Gregory Marsh MRN: [id number] Date of Birth: 07-18-1966  Today's Date: 02/16/2012 Time: 4098-1191 OT Time Calculation (min): 52 min Manual Therapy [phone] 24' Therapeutic Exercises 1010-1038 28' Visit#: 10  of 36   Re-eval: 03/15/12    Subjective S:  I can't wait to do more with my arm. Limitations: Reviewed protocol of PROM through 02/20/12 Special Tests: UEFI was 8% on 01/26/12, currently 34/80= 42% Pain Assessment Currently in Pain?: Yes Pain Score:   3 Pain Location: Shoulder Pain Orientation: Left Pain Type: Acute pain  Precautions/Restrictions   PROM through 02/20/12  Exercise/Treatments Supine Protraction: PROM;10 reps Horizontal ABduction: PROM;10 reps External Rotation: PROM;10 reps Internal Rotation: PROM;10 reps Flexion: PROM;10 reps ABduction: PROM;10 reps Other Supine Exercises: bridging x 25 Seated Elevation: AROM;15 reps Extension: AROM;15 reps Row: AROM;15 reps Other Seated Exercises: elbow flexion, extension, pronation, supination, wrist flexion, extension 15 reps with 2# Therapy Ball Flexion: 25 reps ABduction: 25 reps ROM / Strengthening / Isometric Strengthening Anterior Glide: 30" x 2 Caudal Glide: 30" x 2 Flexion: Supine (30" x 2) Extension: Supine (30" x 2) External Rotation: Supine (30" x 2) Internal Rotation: Supine (30" x 2) ABduction: Supine (30" x 2) ADduction: Supine (30" x 2)    Manual Therapy Manual Therapy: Myofascial release Myofascial Release: MFR and manual stretching to left upper arm, scapular, and shoulder region to decrease pain and restrictions and increase pain free PROM within parameters of protocol.  [phone]  Occupational Therapy Assessment and Plan OT Assessment and Plan Clinical Impression Statement: A:  Please refer to MD progress note for details.  Increased to 2# for glide exercises and elbow, forearm, wrist strengthening. OT Plan: P:  Begin AAROM on  02/20/12, increase isometric strengthening to 1' x 1 rep.   Goals Short Term Goals Time to Complete Short Term Goals: 6 weeks Short Term Goal 1: Patient will be educated on HEP. Short Term Goal 1 Progress: Met Short Term Goal 2: Patient will increase left shoulder PROM to [facility] for increased ability to reach overhead at work. Short Term Goal 2 Progress: Progressing toward goal Short Term Goal 3: Patient will increase left shoulder strength to 3+/5 for increased ability to cast his fishing rod. Short Term Goal 3 Progress: Progressing toward goal Short Term Goal 4: Patient will decrease pain to 5/10 in his left shoulder when reaching to shoulder height at work. Short Term Goal 4 Progress: Met Short Term Goal 5: Patient will decrease fascial restrictions from moderate to min-mod in his left shoulder. Short Term Goal 5 Progress: Progressing toward goal Long Term Goals Time to Complete Long Term Goals: 12 weeks Long Term Goal 1: Patient will return to prior level of I with all B/IADLs, work, and leisure activities. Long Term Goal 1 Progress: Progressing toward goal Long Term Goal 2: Patient will increase left shoulder AROM to WNL for increased ability to complete job tasks that involve reaching overhead. Long Term Goal 2 Progress: Progressing toward goal Long Term Goal 3: Patient will increase his left shoulder strength to 5/5 for increased ability to push and pull equipment at work. Long Term Goal 3 Progress: Progressing toward goal Long Term Goal 4: Patient will decrease pain to 2/10 or less in his left shoulder while completing work tasks. Long Term Goal 4 Progress: Progressing toward goal Long Term Goal 5: Patient will decrease fascial restrictions to trace in his left shoulder region. Long Term Goal 5 Progress: Progressing toward goal  Problem List  Patient Active Problem List  Diagnosis  . Rotator cuff tear  . AC separation, type 3, left, sequela  . S/P arthroscopy of shoulder  .  Articular cartilage disorder, shoulder region  . Disorders of bursae and tendons in shoulder region, unspecified  . Primary localized osteoarthrosis, shoulder region  . Pain in joint, shoulder region  . Muscle weakness (generalized)    End of Session Activity Tolerance: Patient tolerated treatment well General Behavior During Session: [facility] for tasks performed Cognition: [facility] for tasks performed  GO    Shirlean Mylar, OTR/L  02/16/2012, 10:46 AM

## 2012-02-17 ENCOUNTER — Encounter: Payer: Self-pay | Admitting: Orthopedic Surgery

## 2012-02-17 ENCOUNTER — Ambulatory Visit (INDEPENDENT_AMBULATORY_CARE_PROVIDER_SITE_OTHER): Payer: Private Health Insurance - Indemnity | Admitting: Orthopedic Surgery

## 2012-02-17 VITALS — BP 116/80 | Ht 72.0 in | Wt 220.0 lb

## 2012-02-17 DIAGNOSIS — M719 Bursopathy, unspecified: Secondary | ICD-10-CM

## 2012-02-17 DIAGNOSIS — M19019 Primary osteoarthritis, unspecified shoulder: Secondary | ICD-10-CM

## 2012-02-17 DIAGNOSIS — M24119 Other articular cartilage disorders, unspecified shoulder: Secondary | ICD-10-CM

## 2012-02-17 DIAGNOSIS — M67919 Unspecified disorder of synovium and tendon, unspecified shoulder: Secondary | ICD-10-CM

## 2012-02-17 DIAGNOSIS — M75100 Unspecified rotator cuff tear or rupture of unspecified shoulder, not specified as traumatic: Secondary | ICD-10-CM

## 2012-02-17 DIAGNOSIS — M25519 Pain in unspecified shoulder: Secondary | ICD-10-CM

## 2012-02-17 DIAGNOSIS — Z9889 Other specified postprocedural states: Secondary | ICD-10-CM

## 2012-02-17 DIAGNOSIS — M75120 Complete rotator cuff tear or rupture of unspecified shoulder, not specified as traumatic: Secondary | ICD-10-CM

## 2012-02-17 NOTE — Patient Instructions (Addendum)
OOW continue 6 weeks

## 2012-02-17 NOTE — Progress Notes (Signed)
Patient ID: Gregory Marsh, male   DOB: Dec 11, 1965, 46 y.o.   MRN: [id number] Chief Complaint  Patient presents with  . Follow-up    4 week recheck left shoulder, DOS 01/09/12    BP 116/80  Ht 6' (1.829 m)  Wt 220 lb (99.791 kg)  BMI 29.84 kg/m2 Routine Post Op  Left shoulder, DOS 01/09/12    PRE-OPERATIVE DIAGNOSIS: torn rotator cuff, symptomatic ac joint arthrosis left shoulder  POST-OPERATIVE DIAGNOSIS:  1. OA LEFT SHOULDER AC JOINT  2. ANTERIOR LABRAL TEAR  3. PARTIAL SUBSCAPULARIS TEAR  4. OS ACROMIALE  5. ROTATOR CUFF TENDONOSIS  PROCEDURE: Procedure(s) (LRB):  1. LEFT SHOULDER ARTHROSCOPY DEBRIDEMENT LIMITED 40981  2. ARTHROSCOPIC ACROMIOPLASTY 29826  3. OPEN DISTAL CLAVICLE 23120  FINDINGS:  Degenerative tear anterior labrum  Partial tear subscapularis intra-articular  Os acromiale, fibrous union  Osteoarthritis acromioclavicular joint   I have reviewed his therapy notes. His passive forward elevation is 120 at postop week 5+. He should continue therapy sling as tolerated and as needed. Continue therapy and follow up in 6 weeks

## 2012-02-18 ENCOUNTER — Ambulatory Visit (HOSPITAL_COMMUNITY)
Admission: RE | Admit: 2012-02-18 | Discharge: 2012-02-18 | Disposition: A | Discharge status: Home / Self Care | Payer: Private Health Insurance - Indemnity | Source: Ambulatory Visit | Attending: Pulmonary Disease | Admitting: Pulmonary Disease

## 2012-02-18 DIAGNOSIS — M6281 Muscle weakness (generalized): Secondary | ICD-10-CM

## 2012-02-18 DIAGNOSIS — M25519 Pain in unspecified shoulder: Secondary | ICD-10-CM

## 2012-02-18 NOTE — Progress Notes (Signed)
Occupational Therapy Treatment Patient Details  Name: Gregory Marsh MRN: [id number] Date of Birth: [birth year]  Today's Date: 02/18/2012 Time: 4696-2952 OT Time Calculation (min): 44 min Manual Therapy [phone] 26' Therapeutic Exercise 1001-1018 17'  Visit#: 11  of 36   Re-eval: 03/15/12    Subjective Symptoms/Limitations Symptoms: S: It doesn't feel too bad today. Pain Assessment Currently in Pain?: Yes Pain Score:   5 Pain Location: Shoulder Pain Orientation: Left Pain Type: Acute pain  Precautions/Restrictions     Exercise/Treatments Supine Protraction: PROM;10 reps Horizontal ABduction: PROM;10 reps External Rotation: PROM;10 reps Internal Rotation: PROM;10 reps Flexion: PROM;10 reps ABduction: PROM;10 reps Other Supine Exercises: bridging x 25 Seated Elevation: AROM;15 reps Extension: AROM;15 reps Row: AROM;15 reps Other Seated Exercises: elbow flexion, extension, pronation, supination, wrist flexion, extension 18 reps with 2# Therapy Ball Flexion: 25 reps ABduction: 25 reps ROM / Strengthening / Isometric Strengthening Anterior Glide: 30" x 2 Caudal Glide: 30" x 2 Flexion: Supine (1 x 1') Extension: Supine (1x1') External Rotation: Supine (1x1') Internal Rotation: Supine (1x1') ABduction: Supine (1x1') ADduction: Supine (1x1')      Manual Therapy Manual Therapy: Myofascial release Myofascial Release: MFR and manual stretching to left upper arm, scapular, and shoulder region to decrease pain and restrictions and increase pain free PROM within parameters of protocol.  Occupational Therapy Assessment and Plan OT Assessment and Plan Clinical Impression Statement: A:  MD instructed patient he can decrease the use of his sling, also in MD note.  Increased isometric hold to 1' OT Plan: P: Begin AAROM on 08/23/11   Goals Short Term Goals Time to Complete Short Term Goals: 6 weeks Short Term Goal 1: Patient will be educated on HEP. Short Term Goal 2:  Patient will increase left shoulder PROM to [facility] for increased ability to reach overhead at work. 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 Short Term Goal 4: Patient will decrease pain to 5/10 in his left shoulder when reaching to shoulder height at work. Short Term Goal 5: Patient will decrease fascial restrictions from moderate to min-mod in his left shoulder. Long Term Goals Time to Complete Long Term Goals: 12 weeks Long Term Goal 1: Patient will return to prior level of I with all B/IADLs, work, and leisure activities. Long Term Goal 2: Patient will increase left shoulder AROM to WNL for increased ability to complete job tasks that involve reaching overhead. Long Term Goal 3: Patient will increase his left shoulder strength to 5/5 for increased ability to push and pull equipment at work. Long Term Goal 4: Patient will decrease pain to 2/10 or less in his left shoulder while completing work tasks. Long Term Goal 5: Patient will decrease fascial restrictions to trace in his left shoulder region.  Problem List Patient Active Problem List  Diagnosis  . Rotator cuff tear  . AC separation, type 3, left, sequela  . S/P arthroscopy of shoulder  . Articular cartilage disorder, shoulder region  . Disorders of bursae and tendons in shoulder region, unspecified  . Primary localized osteoarthrosis, shoulder region  . Pain in joint, shoulder region  . Muscle weakness (generalized)    End of Session Activity Tolerance: Patient tolerated treatment well General Behavior During Session: [facility] for tasks performed Cognition: [facility] for tasks performed  GO    Noralee Stain, Gervis Gaba L 02/18/2012, 10:18 AM

## 2012-02-20 ENCOUNTER — Ambulatory Visit (HOSPITAL_COMMUNITY)
Admission: RE | Admit: 2012-02-20 | Discharge: 2012-02-20 | Disposition: A | Discharge status: Home / Self Care | Payer: Private Health Insurance - Indemnity | Source: Ambulatory Visit | Attending: Occupational Therapy | Admitting: Occupational Therapy

## 2012-02-20 ENCOUNTER — Encounter: Payer: Self-pay | Admitting: Orthopedic Surgery

## 2012-02-20 DIAGNOSIS — M6281 Muscle weakness (generalized): Secondary | ICD-10-CM

## 2012-02-20 DIAGNOSIS — M25519 Pain in unspecified shoulder: Secondary | ICD-10-CM

## 2012-02-20 NOTE — Progress Notes (Addendum)
Occupational Therapy Treatment Patient Details  Name: Gregory Marsh MRN: [id number] Date of Birth: 04-Nov-1965  Today's Date: 02/20/2012 Time: 4098-1191 OT Time Calculation (min): 45 min Manual Therapy [phone] 18' Therapeutic Exercise [phone]   Visit#: 13  of 36   Re-eval: 03/15/12 Assessment Diagnosis: S/P Left SAD and Arthroscopic Acromioplasty Next MD Visit: 0709/13 Subjective Symptoms/Limitations Symptoms: S:  I feel pretty good. i tried it today without the sling. Pain Assessment Currently in Pain?: Yes Pain Score:   5  Precautions/Restrictions  Precautions Precautions: Shoulder Type of Shoulder Precautions: PROM x 6 weeks 02/20/12  Exercise/Treatments Supine Protraction: PROM;10 reps Horizontal ABduction: PROM;10 reps External Rotation: PROM;10 reps Internal Rotation: PROM;10 reps Flexion: PROM;10 reps ABduction: PROM;10 reps Other Supine Exercises: bridging x 25 Seated Elevation: AROM;15 reps Extension: AROM;15 reps Row: AROM;15 reps Other Seated Exercises: elbow flexion, extension, pronation, supination, wrist flexion, extension 18 reps with 2# Therapy Ball Flexion: 25 reps ABduction: 25 reps ROM / Strengthening / Isometric Strengthening Anterior Glide: 1'x2 Caudal Glide: 1'x2 Flexion: Supine (1 x1') Extension: Supine (1x1') External Rotation: Supine (1x1') Internal Rotation: Supine (1 x1') ABduction: Supine (1x1') ADduction: Supine (1x1')      Manual Therapy Manual Therapy: Myofascial release Myofascial Release: MFR and manual stretching to left upper arm, scapular, and shoulder region to decrease pain and restrictions and increase pain free PROM within parameters of protocol  Occupational Therapy Assessment and Plan OT Assessment and Plan Clinical Impression Statement: A: PROM WFL, soft end feel and not painfull. OT Plan: P:  According to protocol patient may begin AAROm at next visit.   Goals Short Term Goals Time to Complete Short Term  Goals: 6 weeks Short Term Goal 1: Patient will be educated on HEP. Short Term Goal 2: Patient will increase left shoulder PROM to [facility] for increased ability to reach overhead at work. 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 Short Term Goal 4: Patient will decrease pain to 5/10 in his left shoulder when reaching to shoulder height at work. Short Term Goal 5: Patient will decrease fascial restrictions from moderate to min-mod in his left shoulder. Long Term Goals Time to Complete Long Term Goals: 12 weeks Long Term Goal 1: Patient will return to prior level of I with all B/IADLs, work, and leisure activities. Long Term Goal 2: Patient will increase left shoulder AROM to WNL for increased ability to complete job tasks that involve reaching overhead. Long Term Goal 3: Patient will increase his left shoulder strength to 5/5 for increased ability to push and pull equipment at work. Long Term Goal 4: Patient will decrease pain to 2/10 or less in his left shoulder while completing work tasks. Long Term Goal 5: Patient will decrease fascial restrictions to trace in his left shoulder region.  Problem List Patient Active Problem List  Diagnosis  . Rotator cuff tear  . AC separation, type 3, left, sequela  . S/P arthroscopy of shoulder  . Articular cartilage disorder, shoulder region  . Disorders of bursae and tendons in shoulder region, unspecified  . Primary localized osteoarthrosis, shoulder region  . Pain in joint, shoulder region  . Muscle weakness (generalized)    End of Session Activity Tolerance: Patient tolerated treatment well General Behavior During Session: [facility] for tasks performed Cognition: [facility] for tasks performed  GO    Noralee Stain, Allona Gondek L 02/20/2012, 10:31 AM

## 2012-02-23 ENCOUNTER — Telehealth: Payer: Self-pay | Admitting: Orthopedic Surgery

## 2012-02-23 NOTE — Telephone Encounter (Signed)
On Friday 02/20/12, Medical office notes, work note faxed as per request, to [facility], attention Armandina Stammer, to fax #[phone].  Authorization on file.

## 2012-02-24 ENCOUNTER — Ambulatory Visit (HOSPITAL_COMMUNITY)
Admission: RE | Admit: 2012-02-24 | Discharge: 2012-02-24 | Disposition: A | Discharge status: Home / Self Care | Payer: Private Health Insurance - Indemnity | Source: Ambulatory Visit | Attending: Pulmonary Disease | Admitting: Pulmonary Disease

## 2012-02-24 DIAGNOSIS — M25519 Pain in unspecified shoulder: Secondary | ICD-10-CM

## 2012-02-24 DIAGNOSIS — M6281 Muscle weakness (generalized): Secondary | ICD-10-CM

## 2012-02-24 NOTE — Progress Notes (Signed)
Occupational Therapy Treatment Patient Details  Name: Gregory Marsh MRN: [id number] Date of Birth: 11-12-1965  Today's Date: 02/24/2012 Time: 4098-1191 OT Time Calculation (min): 42 min Manual Therapy [phone] 18' Therapeutic Exercise [phone] 23'  Visit#: 14  of 36   Re-eval: 03/15/12   Subjective Symptoms/Limitations Symptoms: S:  It hurts if I do certain things, still can't sleep on that side. Pain Assessment Currently in Pain?: Yes Pain Score:   5 Pain Location: Shoulder Pain Orientation: Left Pain Type: Acute pain  Precautions/Restrictions  Precautions Precautions: Shoulder Type of Shoulder Precautions: PROM x 6 weeks 02/20/12  Exercise/Treatments  02/24/12 0900  Shoulder Exercises: Supine  Protraction PROM;AAROM;10 reps  Horizontal ABduction PROM;AAROM;10 reps  External Rotation PROM;AAROM;10 reps  Internal Rotation PROM;AAROM;10 reps  Flexion PROM;AAROM;10 reps  ABduction PROM;AAROM;10 reps  Other Supine Exercises d/c  Shoulder Exercises: Seated  Elevation AROM;15 reps  Extension AROM;15 reps  Row AROM;15 reps  Other Seated Exercises resume  Shoulder Exercises: Pulleys  Flexion 1 minute  ABduction 1 minute  Shoulder Exercises: Therapy Ball  Flexion 25 reps  ABduction 25 reps  Shoulder Exercises: ROM/Strengthening  Anterior Glide resume  Caudal Glide resume  Shoulder Exercises: Isometric Strengthening  Flexion (d/c)  Extension (d/c)  External Rotation (d/c)  Internal Rotation (d/c)  ABduction (d/c)  ADduction (d/c)       Manual Therapy Manual Therapy: Myofascial release Myofascial Release: MFR and manual stretching to left upper arm, scapular, and shoulder region to decrease pain and restrictions and increase pain free PROM within parameters of protocol   Occupational Therapy Assessment and Plan OT Assessment and Plan Clinical Impression Statement: A:  According to protocol begin AAROM supine with dowel, also added pulleys both of which patient  tolerated well, no significant increase in pain (the same or less than when he arrived)  Patient had good form and pace with new exercises.  Encouraged patient to ice at home.   Rehab Potential: Excellent OT Plan: P:  Resume ex. missed because of time.   Goals Short Term Goals Time to Complete Short Term Goals: 6 weeks Short Term Goal 1: Patient will be educated on HEP. Short Term Goal 2: Patient will increase left shoulder PROM to [facility] for increased ability to reach overhead at work. 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 Short Term Goal 4: Patient will decrease pain to 5/10 in his left shoulder when reaching to shoulder height at work. Short Term Goal 5: Patient will decrease fascial restrictions from moderate to min-mod in his left shoulder. Long Term Goals Time to Complete Long Term Goals: 12 weeks Long Term Goal 1: Patient will return to prior level of I with all B/IADLs, work, and leisure activities. Long Term Goal 2: Patient will increase left shoulder AROM to WNL for increased ability to complete job tasks that involve reaching overhead. Long Term Goal 3: Patient will increase his left shoulder strength to 5/5 for increased ability to push and pull equipment at work. Long Term Goal 4: Patient will decrease pain to 2/10 or less in his left shoulder while completing work tasks. Long Term Goal 5: Patient will decrease fascial restrictions to trace in his left shoulder region.  Problem List Patient Active Problem List  Diagnosis  . Rotator cuff tear  . AC separation, type 3, left, sequela  . S/P arthroscopy of shoulder  . Articular cartilage disorder, shoulder region  . Disorders of bursae and  tendons in shoulder region, unspecified  . Primary localized osteoarthrosis, shoulder region  . Pain in joint, shoulder region  . Muscle weakness (generalized)    End of Session Activity Tolerance: Patient  tolerated treatment well General Behavior During Session: [facility] for tasks performed Cognition: [facility] for tasks performed  GO   Dannisha Eckmann L. Bartosz Luginbill, COTA/L  02/24/2012, 1:15 PM

## 2012-02-25 ENCOUNTER — Ambulatory Visit (HOSPITAL_COMMUNITY)
Admission: RE | Admit: 2012-02-25 | Discharge: 2012-02-25 | Disposition: A | Discharge status: Home / Self Care | Payer: Private Health Insurance - Indemnity | Source: Ambulatory Visit | Attending: Orthopedic Surgery | Admitting: Orthopedic Surgery

## 2012-02-25 DIAGNOSIS — M25519 Pain in unspecified shoulder: Secondary | ICD-10-CM

## 2012-02-25 DIAGNOSIS — M6281 Muscle weakness (generalized): Secondary | ICD-10-CM

## 2012-02-25 NOTE — Progress Notes (Signed)
Note reviewed by clinical instructor and accurately reflects treatment session.  Bethany H. Murray, OTR/L  

## 2012-02-25 NOTE — Progress Notes (Signed)
Occupational Therapy Treatment Patient Details  Name: Gregory Marsh MRN: [id number] Date of Birth: 1966-02-22  Today's Date: 02/25/2012 Time: 1191-4782 OT Time Calculation (min): 49 min  Manual Therapy: 9562-1308 25' Therapeutic Exercise: 6578-4696 19' Visit#: 15  of 36   Re-eval: 03/15/12   Subjective Symptoms/Limitations Symptoms: S: It was more sore than usual after those exercises we added the other day. Pain Assessment Currently in Pain?: Yes Pain Score:   5 Pain Location: Shoulder Pain Orientation: Left Pain Type: Acute pain Pain Radiating Towards: Pain in anterior shoulder radiates down into upper arm.  Precautions/Restrictions   P/AAROM only through week 6 (7/26), then can progress to AROM once patient demonstrates full AAROM.  Exercise/Treatments Supine Protraction: PROM;AAROM;10 reps Horizontal ABduction: PROM;AAROM;10 reps External Rotation: PROM;AAROM;10 reps Internal Rotation: PROM;AAROM;10 reps Flexion: PROM;AAROM;10 reps ABduction: PROM;AAROM;10 reps Seated Elevation: AROM;15 reps Extension: AROM;15 reps Row: AROM;15 reps Other Seated Exercises: resume   Pulleys Flexion: Other (comment) (1'30") ABduction: Other (comment) (1'30") Therapy Ball Flexion: 20 reps ABduction: 20 reps;Other (comment) (lessened reps due to time) ROM / Strengthening / Isometric Strengthening Anterior Glide: dc Caudal Glide: dc      Manual Therapy Manual Therapy: Myofascial release Myofascial Release: MFR and manual stretching to left scapular, trapezius, shoulder and upper arm region to decrease pain and restrictions and increase pain free P/AAROM within parameters of protocol. Increased focus on anterior shoulder as this was the region with the most restrictions and spasms.  Occupational Therapy Assessment and Plan OT Assessment and Plan Clinical Impression Statement: A: Maintained reps of AAROM in supine due to patient's rigidity and slow pace with these movements.  Increase duration of pulleys and patient required verbal/tactile cueing to keep shoulder relaxed and depressed. Patient is very tense during pulley and dowel exercises which he reports is due to anticipation of pain. Educated patient that the more relaxed he is the less pain he will experience. OT Plan: Resume elbow flex/ext, pron/sup and wrist curls with weight. Increase reps of AAROM dowel rod and remind patient to keep L shoulder relaxed/depressed.   Goals Short Term Goals Time to Complete Short Term Goals: 6 weeks Short Term Goal 1: Patient will be educated on HEP. Short Term Goal 1 Progress: Met Short Term Goal 2: Patient will increase left shoulder PROM to [facility] for increased ability to reach overhead at work. Short Term Goal 2 Progress: Progressing toward goal Short Term Goal 3: Patient will increase left shoulder strength to 3+/5 for increased ability to cast his fishing rod. Short Term Goal 3 Progress: Progressing toward goal Short Term Goal 4: Patient will decrease pain to 5/10 in his left shoulder when reaching to shoulder height at work. Short Term Goal 4 Progress: Progressing toward goal Short Term Goal 5: Patient will decrease fascial restrictions from moderate to min-mod in his left shoulder. Short Term Goal 5 Progress: Progressing toward goal Long Term Goals Time to Complete Long Term Goals: 12 weeks Long Term Goal 1: Patient will return to prior level of I with all B/IADLs, work, and leisure activities. Long Term Goal 1 Progress: Progressing toward goal Long Term Goal 2: Patient will increase left shoulder AROM to WNL for increased ability to complete job tasks that involve reaching overhead. Long Term Goal 2 Progress: Progressing toward goal Long Term Goal 3: Patient will increase his left shoulder strength to 5/5 for increased ability to push and pull equipment at work. Long Term Goal 3 Progress: Progressing toward goal Long Term Goal 4: Patient will decrease  pain to 2/10 or  less in his left shoulder while completing work tasks. Long Term Goal 4 Progress: Progressing toward goal Long Term Goal 5: Patient will decrease fascial restrictions to trace in his left shoulder region. Long Term Goal 5 Progress: Progressing toward goal  Problem List Patient Active Problem List  Diagnosis  . Rotator cuff tear  . AC separation, type 3, left, sequela  . S/P arthroscopy of shoulder  . Articular cartilage disorder, shoulder region  . Disorders of bursae and tendons in shoulder region, unspecified  . Primary localized osteoarthrosis, shoulder region  . Pain in joint, shoulder region  . Muscle weakness (generalized)    End of Session Activity Tolerance: Patient tolerated treatment well General Behavior During Session: [facility] for tasks performed Cognition: [facility] for tasks performed  GO   Laverta Baltimore, OTS Occupational Therapy Student  02/25/2012, 9:03 AM

## 2012-03-02 ENCOUNTER — Ambulatory Visit (HOSPITAL_COMMUNITY)
Admission: RE | Admit: 2012-03-02 | Discharge: 2012-03-02 | Disposition: A | Discharge status: Home / Self Care | Payer: Private Health Insurance - Indemnity | Source: Ambulatory Visit | Attending: Pulmonary Disease | Admitting: Pulmonary Disease

## 2012-03-02 NOTE — Progress Notes (Signed)
Note reviewed by clinical instructor and accurately reflects treatment session.  Topher Buenaventura H. Cailan General, OTR/L  

## 2012-03-02 NOTE — Progress Notes (Signed)
Occupational Therapy Treatment Patient Details  Name: Gregory Marsh MRN: [id number] Date of Birth: 11-26-1965  Today's Date: 03/02/2012 Time: 7829-5621 OT Time Calculation (min): 50 min  Manual Therapy: 3086-5784 25' Therapeutic Exercise: 6962-9528 25' Visit#: 16  of 36   Re-eval: 03/15/12    Subjective Symptoms/Limitations Symptoms: S: Not having any pain really, just tightness. Pain Assessment Currently in Pain?: No/denies (No pain, minimal tightness/stiffness) Pain Score: 0-No pain  Precautions/Restrictions   P/AAROM only through week 6 (7/26), then can progress to AROM once patient demonstrates full AAROM.  Exercise/Treatments Supine Protraction: PROM;10 reps;AAROM;12 reps Horizontal ABduction: PROM;10 reps;AAROM;12 reps External Rotation: PROM;10 reps;AAROM;12 reps Internal Rotation: PROM;10 reps;AAROM;12 reps Flexion: PROM;10 reps;AAROM;12 reps ABduction: PROM;10 reps;AAROM;12 reps Seated Elevation: AROM;15 reps Extension: AROM;15 reps Row: AROM;15 reps Other Seated Exercises: elbow flexion, extension, pronation, supination, wrist flexion, extension 15 reps with 2#   Pulleys Flexion: Other (comment) (1'30") ABduction: Other (comment) (1'30") Therapy Ball Flexion: 25 reps ABduction: 25 reps     Manual Therapy Manual Therapy: Myofascial release Myofascial Release: MFR and manual stretching to left scapular, trapezius, shoulder and upper arm region to decrease pain and restriction and increase pain free P/AAROm within parameters of protocol.   Occupational Therapy Assessment and Plan OT Assessment and Plan Clinical Impression Statement: Increased AAROM reps, patient very rigid with AAROM in supine and pulleys. Required verbal and tactile cues during both to keep scapula depressed and shoulder relaxed.  OT Plan: P: Increase reps of AAROM and remind patient to keep  L shoulder relaxed and depressed for increased fluidity of movement.   Goals Short Term  Goals Time to Complete Short Term Goals: 6 weeks Short Term Goal 1: Patient will be educated on HEP. Short Term Goal 2: Patient will increase left shoulder PROM to [facility] for increased ability to reach overhead at work. 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 Short Term Goal 4: Patient will decrease pain to 5/10 in his left shoulder when reaching to shoulder height at work. Short Term Goal 5: Patient will decrease fascial restrictions from moderate to min-mod in his left shoulder. Long Term Goals Time to Complete Long Term Goals: 12 weeks Long Term Goal 1: Patient will return to prior level of I with all B/IADLs, work, and leisure activities. Long Term Goal 2: Patient will increase left shoulder AROM to WNL for increased ability to complete job tasks that involve reaching overhead. Long Term Goal 3: Patient will increase his left shoulder strength to 5/5 for increased ability to push and pull equipment at work. Long Term Goal 4: Patient will decrease pain to 2/10 or less in his left shoulder while completing work tasks. Long Term Goal 5: Patient will decrease fascial restrictions to trace in his left shoulder region.  Problem List Patient Active Problem List  Diagnosis  . Rotator cuff tear  . AC separation, type 3, left, sequela  . S/P arthroscopy of shoulder  . Articular cartilage disorder, shoulder region  . Disorders of bursae and tendons in shoulder region, unspecified  . Primary localized osteoarthrosis, shoulder region  . Pain in joint, shoulder region  . Muscle weakness (generalized)    End of Session Activity Tolerance: Patient tolerated treatment well General Behavior During Session: [facility] for tasks performed Cognition: [facility] for tasks performed  GO   Laverta Baltimore, OTS Occupational Therapy Student  03/02/2012, 12:14 PM

## 2012-03-04 ENCOUNTER — Ambulatory Visit (HOSPITAL_COMMUNITY)
Admission: RE | Admit: 2012-03-04 | Discharge: 2012-03-04 | Disposition: A | Discharge status: Home / Self Care | Payer: Private Health Insurance - Indemnity | Source: Ambulatory Visit | Attending: Pulmonary Disease | Admitting: Pulmonary Disease

## 2012-03-04 DIAGNOSIS — M6281 Muscle weakness (generalized): Secondary | ICD-10-CM

## 2012-03-04 DIAGNOSIS — M25519 Pain in unspecified shoulder: Secondary | ICD-10-CM

## 2012-03-04 NOTE — Progress Notes (Signed)
Occupational Therapy Treatment Patient Details  Name: Gregory Marsh MRN: [id number] Date of Birth: June 19, 1966  Today's Date: 03/04/2012 Time: 2956-2130 OT Time Calculation (min): 62 min Manual Therapy [phone] 23' Therapeutic Exercises [phone] 33' Visit#: 17  of 36   Re-eval: 03/15/12    Subjective S:  Its a little sore, especially in the front of the shoulder. Pain Assessment Currently in Pain?: Yes Pain Score:   1 Pain Location: Shoulder Pain Orientation: Left;Anterior Pain Type: Acute pain  Precautions/Restrictions   P/AAROM only through week 6 (7/26), then can progress to AROM once patient demonstrates full AAROM.   Exercise/Treatments Supine Protraction: PROM;10 reps;AAROM;12 reps Horizontal ABduction: PROM;10 reps;AAROM;12 reps External Rotation: PROM;10 reps;AAROM;12 reps Internal Rotation: PROM;10 reps;AAROM;12 reps Flexion: PROM;10 reps;AAROM;12 reps ABduction: PROM;10 reps;AAROM;12 reps Seated Elevation: AROM;15 reps Extension: AROM;15 reps Row: AROM;15 reps Other Seated Exercises: elbow flexion, extension, pronation, supination, wrist flexion, extension 20 reps with 2# Pulleys Flexion: 2 minutes ABduction: 2 minutes Therapy Ball Flexion: 25 reps ABduction: 25 reps ROM / Strengthening / Isometric Strengthening Prot/Ret//Elev/Dep: 1'      Manual Therapy Manual Therapy: Myofascial release Myofascial Release: MFR and manual stretching to left scapular, trapezius, anterior shoulder, and upper arm region to decrease pain and restrictions and increase pain free AAROM and PROM.  [phone]  Occupational Therapy Assessment and Plan OT Assessment and Plan Clinical Impression Statement: A:  AAROM into abduction difficult for patient.  Added prot/ret/elev/dep for increased shoulder girdle stability, Less cuing required for maintaining depressed elbow  OT Plan: P:  Increase AAROM into external rotation and abduction to [facility].  Begin AROM, per protocol, once full  functional AAROM is achieved.   Goals Short Term Goals Time to Complete Short Term Goals: 6 weeks Short Term Goal 1: Patient will be educated on HEP. Short Term Goal 2: Patient will increase left shoulder PROM to [facility] for increased ability to reach overhead at work. 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 Short Term Goal 4: Patient will decrease pain to 5/10 in his left shoulder when reaching to shoulder height at work. Short Term Goal 5: Patient will decrease fascial restrictions from moderate to min-mod in his left shoulder. Long Term Goals Time to Complete Long Term Goals: 12 weeks Long Term Goal 1: Patient will return to prior level of I with all B/IADLs, work, and leisure activities. Long Term Goal 2: Patient will increase left shoulder AROM to WNL for increased ability to complete job tasks that involve reaching overhead. Long Term Goal 3: Patient will increase his left shoulder strength to 5/5 for increased ability to push and pull equipment at work. Long Term Goal 4: Patient will decrease pain to 2/10 or less in his left shoulder while completing work tasks. Long Term Goal 5: Patient will decrease fascial restrictions to trace in his left shoulder region.  Problem List Patient Active Problem List  Diagnosis  . Rotator cuff tear  . AC separation, type 3, left, sequela  . S/P arthroscopy of shoulder  . Articular cartilage disorder, shoulder region  . Disorders of bursae and tendons in shoulder region, unspecified  . Primary localized osteoarthrosis, shoulder region  . Pain in joint, shoulder region  . Muscle weakness (generalized)    End of Session Activity Tolerance: Patient tolerated treatment well General Behavior During Session: [facility] for tasks performed Cognition: [facility] for tasks performed  Shirlean Mylar, OTR/L  03/04/2012, 11:31 AM

## 2012-03-05 ENCOUNTER — Ambulatory Visit (HOSPITAL_COMMUNITY)
Admission: RE | Admit: 2012-03-05 | Discharge: 2012-03-05 | Disposition: A | Discharge status: Home / Self Care | Payer: Private Health Insurance - Indemnity | Source: Ambulatory Visit | Attending: Pulmonary Disease | Admitting: Pulmonary Disease

## 2012-03-05 DIAGNOSIS — M25519 Pain in unspecified shoulder: Secondary | ICD-10-CM

## 2012-03-05 DIAGNOSIS — M6281 Muscle weakness (generalized): Secondary | ICD-10-CM

## 2012-03-05 NOTE — Progress Notes (Signed)
Note reviewed by clinical instructor and accurately reflects treatment session.  Cinnamon Morency H. Eliya Geiman, OTR/L  

## 2012-03-05 NOTE — Progress Notes (Signed)
Occupational Therapy Treatment Patient Details  Name: Gregory Marsh MRN: [id number] Date of Birth: Nov 07, 1965  Today's Date: 03/05/2012 Time: 4098-1191 OT Time Calculation (min): 51 min  Manual Therapy: [id number] 20' Therapeutic Exercise: [id number] 31' Visit#: 18  of 36   Re-eval: 03/15/12   Subjective Symptoms/Limitations Symptoms: I'm a little sore from therapy yesterday. Pain Assessment Currently in Pain?: Yes Pain Score:   1 Pain Location: Shoulder Pain Orientation: Left;Anterior Pain Type: Acute pain  Precautions/Restrictions   Per protocol, can progress to AROM once patient demonstrates full AAROM.  Exercise/Treatments Supine Protraction: PROM;10 reps;AAROM;15 reps Horizontal ABduction: PROM;10 reps;AAROM;15 reps External Rotation: PROM;10 reps;AAROM;15 reps Internal Rotation: PROM;10 reps;AAROM;15 reps Flexion: PROM;10 reps;AAROM;15 reps ABduction: PROM;10 reps;AAROM;15 reps Seated Elevation: AROM;15 reps Extension: AROM;15 reps Row: AROM;15 reps Other Seated Exercises: resume next visit, held due to time   Pulleys Flexion: 3 minutes ABduction: 3 minutes Therapy Ball Flexion: 25 reps ABduction: 25 reps ROM / Strengthening / Isometric Strengthening Prot/Ret//Elev/Dep: 1'        Manual Therapy Manual Therapy: Myofascial release Myofascial Release: MFR and manual stretching to left scapular, trapezius, anterior shoulder and upper arm region to decrease pain and restrictions and increase pain free P/AAROM. [phone].  Occupational Therapy Assessment and Plan OT Assessment and Plan Clinical Impression Statement: A: Difficulty and rigidity noted with AAROM in supine, especially abduction. Improvements noted in PROM. Increased duration of pulleys. OT Plan: P: Continue AAROM until closer to full range, then progress to AROM.   Goals Short Term Goals Time to Complete Short Term Goals: 6 weeks Short Term Goal 1: Patient will be educated on HEP. Short Term Goal  1 Progress: Met Short Term Goal 2: Patient will increase left shoulder PROM to [facility] for increased ability to reach overhead at work. Short Term Goal 2 Progress: Progressing toward goal Short Term Goal 3: Patient will increase left shoulder strength to 3+/5 for increased ability to cast his fishing rod. Short Term Goal 3 Progress: Progressing toward goal Short Term Goal 4: Patient will decrease pain to 5/10 in his left shoulder when reaching to shoulder height at work. Short Term Goal 4 Progress: Progressing toward goal Short Term Goal 5: Patient will decrease fascial restrictions from moderate to min-mod in his left shoulder. Short Term Goal 5 Progress: Progressing toward goal Long Term Goals Time to Complete Long Term Goals: 12 weeks Long Term Goal 1: Patient will return to prior level of I with all B/IADLs, work, and leisure activities. Long Term Goal 1 Progress: Progressing toward goal Long Term Goal 2: Patient will increase left shoulder AROM to WNL for increased ability to complete job tasks that involve reaching overhead. Long Term Goal 2 Progress: Progressing toward goal Long Term Goal 3: Patient will increase his left shoulder strength to 5/5 for increased ability to push and pull equipment at work. Long Term Goal 3 Progress: Progressing toward goal Long Term Goal 4: Patient will decrease pain to 2/10 or less in his left shoulder while completing work tasks. Long Term Goal 4 Progress: Progressing toward goal Long Term Goal 5: Patient will decrease fascial restrictions to trace in his left shoulder region. Long Term Goal 5 Progress: Progressing toward goal  Problem List Patient Active Problem List  Diagnosis  . Rotator cuff tear  . AC separation, type 3, left, sequela  . S/P arthroscopy of shoulder  . Articular cartilage disorder, shoulder region  . Disorders of bursae and tendons in shoulder region, unspecified  . Primary localized osteoarthrosis, shoulder  region  . Pain in  joint, shoulder region  . Muscle weakness (generalized)    End of Session Activity Tolerance: Patient tolerated treatment well General Behavior During Session: [facility] for tasks performed Cognition: [place] for tasks performed  GO   Laverta Baltimore, OTS Occupational Therapy Student  03/05/2012, 9:59 AM

## 2012-03-09 ENCOUNTER — Ambulatory Visit (HOSPITAL_COMMUNITY)
Admission: RE | Admit: 2012-03-09 | Discharge: 2012-03-09 | Disposition: A | Discharge status: Home / Self Care | Payer: Private Health Insurance - Indemnity | Source: Ambulatory Visit | Attending: Pulmonary Disease | Admitting: Pulmonary Disease

## 2012-03-09 DIAGNOSIS — M6281 Muscle weakness (generalized): Secondary | ICD-10-CM

## 2012-03-09 DIAGNOSIS — M25519 Pain in unspecified shoulder: Secondary | ICD-10-CM

## 2012-03-09 NOTE — Progress Notes (Signed)
Occupational Therapy Treatment Patient Details  Name: ADEDAMOLA SETO MRN: [id number] Date of Birth: Oct 27, 1965  Today's Date: 03/09/2012 Time: 1308-6578 OT Time Calculation (min): 46 min Manual Therapy [phone] 13' Therapeutic Exercise 908-940 32'  Visit#: 19  of 36   Re-eval: 03/15/12 Assessment Diagnosis: S/P Left SAD and Arthroscopic Acromioplasty Surgical Date: 01/09/12  Subjective Symptoms/Limitations Symptoms: S:  I can tell it is there but it doesn't hurt too bad. Pain Assessment Currently in Pain?: Yes Pain Score:   4 Pain Location: Shoulder Pain Orientation: Left  Precautions/Restrictions  Precautions Precautions: Shoulder Type of Shoulder Precautions: PROM x 6 weeks 02/20/12  Exercise/Treatments Supine Protraction: PROM;10 reps;AAROM;15 reps Horizontal ABduction: PROM;10 reps;AAROM;15 reps External Rotation: PROM;10 reps;AAROM;15 reps Internal Rotation: PROM;10 reps;AAROM;15 reps Flexion: PROM;10 reps;AAROM;15 reps ABduction: PROM;10 reps;AAROM;15 reps Seated Elevation: AROM;15 reps Extension: AROM;15 reps Row: AROM;15 reps Protraction: AAROM;10 reps Horizontal ABduction: AAROM;10 reps External Rotation: AAROM;10 reps Internal Rotation: AAROM;10 reps Flexion: AAROM;10 reps Abduction: AAROM;10 reps Other Seated Exercises: time Pulleys Flexion: 3 minutes ABduction: 3 minutes Therapy Ball Flexion: 25 reps ABduction: 25 reps ROM / Strengthening / Isometric Strengthening Prot/Ret//Elev/Dep: 1'       Manual Therapy Manual Therapy: Myofascial release Myofascial Release: MFR and manual stretching to left scapular, trapezius, anterior shoulder and upper arm region to decrease pain and restrictions and increase pain free P/AAROM  Occupational Therapy Assessment and Plan OT Assessment and Plan Clinical Impression Statement: A:  Increased fluidity with supine AAROM and patient WFL with supine AAROM.  Added seated AAROM and ball circles which did not cause  any increase in pain.  Patient making great progress with ROM. OT Plan: P:  Begin AROM supine and d/c pullie and begin UBE since patient now has WFL AAROM supine and seated and with pullies.   Goals Short Term Goals Time to Complete Short Term Goals: 6 weeks Short Term Goal 1: Patient will be educated on HEP. Short Term Goal 2: Patient will increase left shoulder PROM to [facility] for increased ability to reach overhead at work. 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 Short Term Goal 4: Patient will decrease pain to 5/10 in his left shoulder when reaching to shoulder height at work. Short Term Goal 5: Patient will decrease fascial restrictions from moderate to min-mod in his left shoulder. Long Term Goals Time to Complete Long Term Goals: 12 weeks Long Term Goal 1: Patient will return to prior level of I with all B/IADLs, work, and leisure activities. Long Term Goal 2: Patient will increase left shoulder AROM to WNL for increased ability to complete job tasks that involve reaching overhead. Long Term Goal 3: Patient will increase his left shoulder strength to 5/5 for increased ability to push and pull equipment at work. Long Term Goal 4: Patient will decrease pain to 2/10 or less in his left shoulder while completing work tasks. Long Term Goal 5: Patient will decrease fascial restrictions to trace in his left shoulder region.  Problem List Patient Active Problem List  Diagnosis  . Rotator cuff tear  . AC separation, type 3, left, sequela  . S/P arthroscopy of shoulder  . Articular cartilage disorder, shoulder region  . Disorders of bursae and tendons in shoulder region, unspecified  . Primary localized osteoarthrosis, shoulder region  . Pain in joint, shoulder region  . Muscle weakness (generalized)    End of Session Activity Tolerance: Patient tolerated treatment well General Behavior  During Session: [facility] for  tasks performed Cognition: [facility] for tasks performed  GO Functional Limitation: Mobility: Walking and moving around  [place], Delila Kuklinski L 03/09/2012, 11:46 AM

## 2012-03-12 ENCOUNTER — Ambulatory Visit (HOSPITAL_COMMUNITY)
Admission: RE | Admit: 2012-03-12 | Discharge: 2012-03-12 | Disposition: A | Discharge status: Home / Self Care | Payer: Private Health Insurance - Indemnity | Source: Ambulatory Visit | Attending: Pulmonary Disease | Admitting: Pulmonary Disease

## 2012-03-12 DIAGNOSIS — M6281 Muscle weakness (generalized): Secondary | ICD-10-CM | POA: Insufficient documentation

## 2012-03-12 DIAGNOSIS — IMO0001 Reserved for inherently not codable concepts without codable children: Secondary | ICD-10-CM | POA: Insufficient documentation

## 2012-03-12 DIAGNOSIS — M25619 Stiffness of unspecified shoulder, not elsewhere classified: Secondary | ICD-10-CM | POA: Insufficient documentation

## 2012-03-12 DIAGNOSIS — M25519 Pain in unspecified shoulder: Secondary | ICD-10-CM | POA: Insufficient documentation

## 2012-03-12 NOTE — Progress Notes (Signed)
Occupational Therapy Treatment        Corrected note for 03/09/2012    Patient Details  Name: Gregory Marsh  MRN: [id number]  Date of Birth: September 30, 1965  Today's Date: 03/09/2012  Time: 4098-1191  OT Time Calculation (min): 46 min  Manual Therapy [phone] 13'  Therapeutic Exercise 908-940 32'  Visit#: 19 of 36  Re-eval: 03/15/12  Assessment  Diagnosis: S/P Left SAD and Arthroscopic Acromioplasty  Surgical Date: 01/09/12  Subjective  Symptoms/Limitations  Symptoms: S: I can tell it is there but it doesn't hurt too bad.  Pain Assessment  Currently in Pain?: Yes  Pain Score: 4  Pain Location: Shoulder  Pain Orientation: Left  Precautions/Restrictions  Precautions  Precautions: Shoulder  Type of Shoulder Precautions: PROM x 6 weeks 02/20/12  Exercise/Treatments  Supine  Protraction: PROM;10 reps;AAROM;15 reps  Horizontal ABduction: PROM;10 reps;AAROM;15 reps  External Rotation: PROM;10 reps;AAROM;15 reps  Internal Rotation: PROM;10 reps;AAROM;15 reps  Flexion: PROM;10 reps;AAROM;15 reps  ABduction: PROM;10 reps;AAROM;15 reps  Seated  Elevation: AROM;15 reps  Extension: AROM;15 reps  Row: AROM;15 reps  Protraction: AAROM;10 reps  Horizontal ABduction: AAROM;10 reps  External Rotation: AAROM;10 reps  Internal Rotation: AAROM;10 reps  Flexion: AAROM;10 reps  Abduction: AAROM;10 reps  Other Seated Exercises: time  Pulleys  Flexion: 3 minutes  ABduction: 3 minutes  Therapy Ball  Flexion: 25 reps  ABduction: 25 reps  Right/ left circles 5 reps ROM / Strengthening / Isometric Strengthening  Prot/Ret//Elev/Dep: 1'    Manual Therapy  Manual Therapy: Myofascial release  Myofascial Release: MFR and manual stretching to left scapular, trapezius, anterior shoulder and upper arm region to decrease pain and restrictions and increase pain free P/AAROM  Occupational Therapy Assessment and Plan  OT Assessment and Plan  Clinical Impression Statement: A: Increased fluidity  with supine AAROM and patient WFL with supine AAROM. Added seated AAROM and ball circles which did not cause any increase in pain. Patient making great progress with ROM.  OT Plan: P: Begin AROM supine and d/c pully and begin UBE since patient now has [facility] AAROM supine and seated and with pullies.  Goals  Short Term Goals  Time to Complete Short Term Goals: 6 weeks  Short Term Goal 1: Patient will be educated on HEP.  Short Term Goal 2: Patient will increase left shoulder PROM to [facility] for increased ability to reach overhead at work.  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  Short Term Goal 4: Patient will decrease pain to 5/10 in his left shoulder when reaching to shoulder height at work.  Short Term Goal 5: Patient will decrease fascial restrictions from moderate to min-mod in his left shoulder.  Long Term Goals  Time to Complete Long Term Goals: 12 weeks  Long Term Goal 1: Patient will return to prior level of I with all B/IADLs, work, and leisure activities.  Long Term Goal 2: Patient will increase left shoulder AROM to WNL for increased ability to complete job tasks that involve reaching overhead.  Long Term Goal 3: Patient will increase his left shoulder strength to 5/5 for increased ability to push and pull equipment at work.  Long Term Goal 4: Patient will decrease pain to 2/10 or less in his left shoulder while completing work tasks.  Long Term Goal 5: Patient will decrease fascial restrictions to trace in his left shoulder region.  Problem List  Patient Active Problem List  Diagnosis   .  Rotator cuff tear   .  AC separation, type 3, left, sequela   .  S/P arthroscopy of shoulder   .  Articular cartilage disorder, shoulder region   .  Disorders of bursae and tendons in shoulder region, unspecified   .  Primary localized osteoarthrosis, shoulder region   .  Pain in joint, shoulder region   .  Muscle  weakness (generalized)    End of Session  Activity Tolerance: Patient tolerated treatment well  General  Behavior During Session: [facility] for tasks performed  Cognition: [facility] for tasks performed  GO  Functional Limitation: Mobility: Walking and moving around  [place], Holt Woolbright L  03/09/2012, 11:46 AM

## 2012-03-12 NOTE — Progress Notes (Signed)
Occupational Therapy Treatment Patient Details  Name: Gregory Marsh MRN: [id number] Date of Birth: 1966/02/25  Today's Date: 03/12/2012 Time: 1103-1208 OT Time Calculation (min): 65 min Manual Therapy [id number] 28' Therapeutic Exercise 1132-1208 36'  Visit#: 20  of 36   Re-eval: 03/15/12     Subjective Symptoms/Limitations Symptoms: S:  If I try to lift it too high it hurts but other than that it is ok. Pain Assessment Currently in Pain?: Yes Pain Score:   5 Pain Location: Shoulder Pain Orientation: Left Pain Type: Acute pain  Precautions/Restrictions  Precautions Precautions: Shoulder Type of Shoulder Precautions: PROM x 6 weeks 02/20/12  Exercise/Treatments   03/12/12 1100  Shoulder Exercises: Supine  Protraction PROM;AROM;10 reps  Horizontal ABduction PROM;AROM;10 reps  External Rotation PROM;AROM;10 reps  Internal Rotation PROM;AROM;10 reps  Flexion PROM;AROM;10 reps  ABduction PROM;AROM;10 reps  Shoulder Exercises: Seated  Elevation AROM;15 reps  Extension AROM;15 reps  Row AROM;15 reps  Protraction AAROM;15 reps  Horizontal ABduction AAROM;15 reps  External Rotation AAROM;15 reps  Internal Rotation AAROM;15 reps  Flexion AAROM;15 reps  Abduction AAROM;15 reps  Other Seated Exercises elbow flexion, extension, pronation, supination, wrist flexion, extension 15 reps with 2#  Shoulder Exercises: Pulleys  Flexion Other (comment) (d/c)  ABduction Other (comment) (d/c)  Shoulder Exercises: Therapy Ball  Flexion 25 reps  ABduction 25 reps  Right/Left 5 reps  Shoulder Exercises: ROM/Strengthening  UBE (Upper Arm Bike) 3' forward, 3' backwards1.0  Wall Wash low wall wash  Thumb Tacks add next session  Prot/Ret//Elev/Dep 1'             Manual Therapy Manual Therapy: Myofascial release Myofascial Release: MFR and manual stretching to left scapular, trapezius, anterior shoulder and upper arm region to decrease pain and restrictions and increase pain  free P/AAROM  Cryotherapy Cryotherapy Location: Shoulder  Occupational Therapy Assessment and Plan OT Assessment and Plan Clinical Impression Statement: A:  Added low wall wash for 2' and UBE with no resistance.  Patient stated the bike made him feel looser.  Wall wash was fatiguing. OT Plan: P:  Begin thumbtacks.   Goals Short Term Goals Time to Complete Short Term Goals: 6 weeks Short Term Goal 1: Patient will be educated on HEP. Short Term Goal 2: Patient will increase left shoulder PROM to [facility] for increased ability to reach overhead at work. 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 Short Term Goal 4: Patient will decrease pain to 5/10 in his left shoulder when reaching to shoulder height at work. Short Term Goal 5: Patient will decrease fascial restrictions from moderate to min-mod in his left shoulder. Long Term Goals Time to Complete Long Term Goals: 12 weeks Long Term Goal 1: Patient will return to prior level of I with all B/IADLs, work, and leisure activities. Long Term Goal 2: Patient will increase left shoulder AROM to WNL for increased ability to complete job tasks that involve reaching overhead. Long Term Goal 3: Patient will increase his left shoulder strength to 5/5 for increased ability to push and pull equipment at work. Long Term Goal 4: Patient will decrease pain to 2/10 or less in his left shoulder while completing work tasks. Long Term Goal 5: Patient will decrease fascial restrictions to trace in his left shoulder region.  Problem List Patient Active Problem List  Diagnosis  . Rotator cuff tear  . AC separation, type 3, left, sequela  . S/P arthroscopy of  shoulder  . Articular cartilage disorder, shoulder region  . Disorders of bursae and tendons in shoulder region, unspecified  . Primary localized osteoarthrosis, shoulder region  . Pain in joint, shoulder region  . Muscle weakness  (generalized)    End of Session Activity Tolerance: Patient tolerated treatment well General Behavior During Session: [facility] for tasks performed Cognition: [facility] for tasks performed  GO    Noralee Stain, Corwyn Vora L 03/12/2012, 4:25 PM

## 2012-03-15 ENCOUNTER — Ambulatory Visit (HOSPITAL_COMMUNITY)
Admission: RE | Admit: 2012-03-15 | Discharge: 2012-03-15 | Disposition: A | Discharge status: Home / Self Care | Payer: Private Health Insurance - Indemnity | Source: Ambulatory Visit | Attending: Pulmonary Disease | Admitting: Pulmonary Disease

## 2012-03-15 DIAGNOSIS — M6281 Muscle weakness (generalized): Secondary | ICD-10-CM

## 2012-03-15 DIAGNOSIS — M25519 Pain in unspecified shoulder: Secondary | ICD-10-CM

## 2012-03-15 NOTE — Progress Notes (Signed)
Occupational Therapy Treatment Patient Details  Name: Gregory Marsh MRN: [id number] Date of Birth: [birth year]  Today's Date: 03/15/2012 Time: 6213-0865 OT Time Calculation (min): 72 min Manual Therapy [phone] 13' Therapeutic Exercises [phone] 17' Visit#: 21  of 36   Re-eval: 04/12/12   Subjective S:  I can still feel it pulling it in the front of my shoulder. Special Tests: UEFI was 42% on 02/16/12 currently: 50/80= 62% Pain Assessment Currently in Pain?: Yes Pain Score:   1 Pain Location: Shoulder Pain Orientation: Left Pain Type: Acute pain  Precautions/Restrictions   Progress as tolerated  Exercise/Treatments Supine Protraction: PROM;10 reps;AROM;15 reps Horizontal ABduction: PROM;10 reps;AROM;15 reps External Rotation: PROM;10 reps;AROM;15 reps Internal Rotation: PROM;10 reps;AROM;15 reps Flexion: PROM;10 reps;AROM;15 reps ABduction: PROM;10 reps;AROM;15 reps Seated Elevation: Other (comment) (dc) Extension: Theraband;10 reps (red) Retraction: Theraband;10 reps (red) Row: Theraband;10 reps (red) Protraction: AROM;10 reps Horizontal ABduction: AROM;10 reps External Rotation: AROM;10 reps Internal Rotation: AROM;10 reps Flexion: AROM;10 reps Abduction: AROM;10 reps Other Seated Exercises: dc Therapy Ball Flexion: 20 reps ABduction: 20 reps Right/Left: 5 reps ROM / Strengthening / Isometric Strengthening UBE (Upper Arm Bike): 3' forward, 3' backwards1.0 Wall Wash: 3' Thumb Tacks: 1' Prot/Ret//Elev/Dep: 1'      Manual Therapy Manual Therapy: Myofascial release Myofascial Release: MFR and manual stretching to left scapular, trapezius, anterior shoulder and upper arm region to decrease pain and restrictions and increase pain free P/AA/AROM.  [phone]  Occupational Therapy Assessment and Plan OT Assessment and Plan Clinical Impression Statement: A:  Demonstated full AAROM in seated today therefore increased to AROM.  Reassess completed, please refer to MD  progress note for details.  supine AROM flexion 123, abduction 128, er 50 ir 88, seated AROM flexion 160, abduction 151, er 55, ir 88, strength 4-/5 except for flexion,which is 3+/5. OT Frequency: Min 3X/week OT Duration: 4 weeks OT Plan: P:  Increase reps with AROM and theraband in seated.  Add seated proximal shoulder strengthening for increased scapular stability.  Continue 3 times per week x 4 weeks.   Goals Short Term Goals Time to Complete Short Term Goals: 6 weeks Short Term Goal 1: Patient will be educated on HEP. Short Term Goal 1 Progress: Met Short Term Goal 2: Patient will increase left shoulder PROM to [facility] for increased ability to reach overhead at work. 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 Short Term Goal 3 Progress: Met Short Term Goal 4: Patient will decrease pain to 5/10 in his left shoulder when reaching to shoulder height at work. Short Term Goal 4 Progress: Met Short Term Goal 5: Patient will decrease fascial restrictions from moderate to min-mod in his left shoulder. Short Term Goal 5 Progress: Met Long Term Goals Time to Complete Long Term Goals: 12 weeks Long Term Goal 1: Patient will return to prior level of I with all B/IADLs, work, and leisure activities. Long Term Goal 1 Progress: Progressing toward goal Long Term Goal 2: Patient will increase left shoulder AROM to WNL for increased ability to complete job tasks that involve reaching overhead. Long Term Goal 2 Progress: Progressing toward goal Long Term Goal 3: Patient will increase his left shoulder strength to 5/5 for increased ability to push and pull equipment at work. Long Term Goal 3 Progress: Progressing toward goal Long Term Goal 4: Patient will decrease pain to 2/10 or less in his left shoulder while completing work  tasks. Long Term Goal 4 Progress: Progressing toward goal Long Term Goal 5:  Patient will decrease fascial restrictions to trace in his left shoulder region. Long Term Goal 5 Progress: Progressing toward goal  Problem List Patient Active Problem List  Diagnosis  . Rotator cuff tear  . AC separation, type 3, left, sequela  . S/P arthroscopy of shoulder  . Articular cartilage disorder, shoulder region  . Disorders of bursae and tendons in shoulder region, unspecified  . Primary localized osteoarthrosis, shoulder region  . Pain in joint, shoulder region  . Muscle weakness (generalized)    End of Session Activity Tolerance: Patient tolerated treatment well General Behavior During Session: [facility] for tasks performed Cognition: [facility] for tasks performed OT Plan of Care OT Home Exercise Plan: Added AROM in standing to HEP.    Shirlean Mylar, OTR/L  03/15/2012, 9:32 AM

## 2012-03-17 ENCOUNTER — Ambulatory Visit (HOSPITAL_COMMUNITY)
Admission: RE | Admit: 2012-03-17 | Discharge: 2012-03-17 | Disposition: A | Discharge status: Home / Self Care | Payer: Private Health Insurance - Indemnity | Source: Ambulatory Visit | Attending: Pulmonary Disease | Admitting: Pulmonary Disease

## 2012-03-17 DIAGNOSIS — M6281 Muscle weakness (generalized): Secondary | ICD-10-CM

## 2012-03-17 DIAGNOSIS — M25519 Pain in unspecified shoulder: Secondary | ICD-10-CM

## 2012-03-17 NOTE — Progress Notes (Signed)
Occupational Therapy Treatment Patient Details  Name: Gregory Marsh MRN: [id number] Date of Birth: 1965/10/21  Today's Date: 03/17/2012 Time: 4098-1191 OT Time Calculation (min): 71 min Manual Therapy [phone] 21' Therapeutic Exercises 825-915 50' Visit#: 22  of 36   Re-eval: 04/12/12   Subjective S:  It felt really loose after working out here on Monday, it felt good.  Its just a little sore this morning. Pain Assessment Currently in Pain?: Yes Pain Score:   1 Pain Location: Shoulder Pain Orientation: Left Pain Type: Acute pain  Precautions/Restrictions   progress as tolerated  Exercise/Treatments Supine Protraction: PROM;5 reps;AROM;15 reps Horizontal ABduction: PROM;5 reps;AROM;15 reps External Rotation: PROM;5 reps;AROM;15 reps Internal Rotation: PROM;5 reps;AROM;15 reps Flexion: PROM;5 reps;AROM;15 reps ABduction: PROM;5 reps;AROM;15 reps Seated Extension: Theraband;10 reps (red) Retraction: Theraband;10 reps (red) Row: Theraband;10 reps (red) Protraction: AROM;12 reps Horizontal ABduction: AROM;12 reps External Rotation: AROM;12 reps;Theraband;10 reps (red) Internal Rotation: AROM;12 reps;Theraband;10 reps (red) Flexion: AROM;12 reps Abduction: AROM;12 reps Therapy Ball Flexion:  (dc) ABduction: Other (comment) (dc) Right/Left: 5 reps ROM / Strengthening / Isometric Strengthening UBE (Upper Arm Bike): 3' forward, 3' backwards 1.5 Wall Wash: 4' Thumb Tacks: 1' Proximal Shoulder Strengthening, Seated: 10 reps each without resting Prot/Ret//Elev/Dep: dc      Manual Therapy Manual Therapy: Myofascial release Myofascial Release: MFR and manual stretching to left scapular, trapezius, upper arm, posterior and anterior shoulder region to decrease fascial restrictions and increase pain free AROM (minimal restrictions noted with most restrictions located in anterior shoulder and trapezius area) [phone]  Occupational Therapy Assessment and Plan OT Assessment and  Plan Clinical Impression Statement: A:  Minimal discomfort at end range of AROM flexion and abduction in supine. OT Plan: P:  Increase to green tband, as red is not providing enought resistance.  Add weight to supine exercises if pain at end range has dissipated.   Goals Short Term Goals Time to Complete Short Term Goals: 6 weeks Short Term Goal 1: Patient will be educated on HEP. Short Term Goal 2: Patient will increase left shoulder PROM to [facility] for increased ability to reach overhead at work. 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 Short Term Goal 4: Patient will decrease pain to 5/10 in his left shoulder when reaching to shoulder height at work. Short Term Goal 5: Patient will decrease fascial restrictions from moderate to min-mod in his left shoulder. Long Term Goals Time to Complete Long Term Goals: 12 weeks Long Term Goal 1: Patient will return to prior level of I with all B/IADLs, work, and leisure activities. Long Term Goal 2: Patient will increase left shoulder AROM to WNL for increased ability to complete job tasks that involve reaching overhead. Long Term Goal 3: Patient will increase his left shoulder strength to 5/5 for increased ability to push and pull equipment at work. Long Term Goal 4: Patient will decrease pain to 2/10 or less in his left shoulder while completing work tasks. Long Term Goal 5: Patient will decrease fascial restrictions to trace in his left shoulder region.  Problem List Patient Active Problem List  Diagnosis  . Rotator cuff tear  . AC separation, type 3, left, sequela  . S/P arthroscopy of shoulder  . Articular cartilage disorder, shoulder region  . Disorders of bursae and tendons in shoulder region, unspecified  . Primary localized osteoarthrosis, shoulder region  . Pain in joint, shoulder region  . Muscle weakness (generalized)  End of Session Activity Tolerance:  Patient tolerated treatment well General Behavior During Session: [facility] for tasks performed Cognition: [facility] for tasks performed  GO    Shirlean Mylar, OTR/L  03/17/2012, 9:59 AM

## 2012-03-19 ENCOUNTER — Ambulatory Visit (HOSPITAL_COMMUNITY)
Admission: RE | Admit: 2012-03-19 | Discharge: 2012-03-19 | Disposition: A | Discharge status: Home / Self Care | Payer: Private Health Insurance - Indemnity | Source: Ambulatory Visit | Attending: Pulmonary Disease | Admitting: Pulmonary Disease

## 2012-03-19 DIAGNOSIS — M6281 Muscle weakness (generalized): Secondary | ICD-10-CM

## 2012-03-19 DIAGNOSIS — M25519 Pain in unspecified shoulder: Secondary | ICD-10-CM

## 2012-03-19 NOTE — Progress Notes (Signed)
Occupational Therapy Treatment Patient Details  Name: Gregory Marsh MRN: [id number] Date of Birth: 03-19-1966  Today's Date: 03/19/2012 Time: 1191-4782 OT Time Calculation (min): 70 min Manual Therapy [phone] 20' Therapeutic Exercises 825-915 28' Visit#: 23  of 36   Re-eval: 04/12/12    Subjective S:  I can feel all that massage that you are doing.   Pain Assessment Currently in Pain?: Yes Pain Score:   3 Pain Location: Shoulder Pain Orientation: Left Pain Type: Acute pain  Precautions/Restrictions   Progress as tolerated  Exercise/Treatments  03/19/12 0800 Shoulder Exercises: Supine Protraction PROM;Strengthening;10 reps Protraction Weight (lbs) 1 Horizontal ABduction PROM;Strengthening;10 reps Horizontal ABduction Weight (lbs) 1 External Rotation PROM;Strengthening;10 reps External Rotation Weight (lbs) 1 Internal Rotation PROM;Strengthening;10 reps Internal Rotation Weight (lbs) 1 Flexion PROM;Strengthening;10 reps Shoulder Flexion Weight (lbs) 1 ABduction PROM;Strengthening;10 reps Shoulder ABduction Weight (lbs) 1 Shoulder Exercises: Seated Extension Theraband;10 reps (green) Retraction Theraband;10 reps (green) Row Theraband;10 reps (green) Protraction AROM;12 reps Horizontal ABduction AROM;12 reps External Rotation AROM;12 reps;Theraband;10 reps (green) Internal Rotation AROM;12 reps;Theraband;10 reps (green) Flexion AROM;12 reps Abduction AROM;12 reps Shoulder Exercises: Therapy Ball Right/Left 5 reps Shoulder Exercises: ROM/Strengthening UBE (Upper Arm Bike) 3' forward, 3' backwards 1.5 Wall Wash 2' with 1# Thumb Tacks 1' "W" Arms x10 reps X to V Arms x 10 reps Proximal Shoulder Strengthening, Seated 10 reps each without resting    Manual Therapy Manual Therapy: Myofascial release Myofascial Release: MFR and manual stretching to left scapular, trapezius, upper arm, posterior and anterior shoulder region to decrease fascial restrictions and  increase pain free AROM [phone]  Occupational Therapy Assessment and Plan OT Assessment and Plan Clinical Impression Statement: A:  Full AROM with no pain in supine this date, therefore added 1# to supine exercises.  Added 1# to wall wash for strengthening of shoulder stabilizers. OT Plan: P:  Increase reps with supine strengthening.  Increase reps with x to v, w arms, proximal shoulder strengthening.   Goals Short Term Goals Time to Complete Short Term Goals: 6 weeks Short Term Goal 1: Patient will be educated on HEP. Short Term Goal 2: Patient will increase left shoulder PROM to [facility] for increased ability to reach overhead at work. 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 Short Term Goal 4: Patient will decrease pain to 5/10 in his left shoulder when reaching to shoulder height at work. Short Term Goal 5: Patient will decrease fascial restrictions from moderate to min-mod in his left shoulder. Long Term Goals Time to Complete Long Term Goals: 12 weeks Long Term Goal 1: Patient will return to prior level of I with all B/IADLs, work, and leisure activities. Long Term Goal 2: Patient will increase left shoulder AROM to WNL for increased ability to complete job tasks that involve reaching overhead. Long Term Goal 3: Patient will increase his left shoulder strength to 5/5 for increased ability to push and pull equipment at work. Long Term Goal 4: Patient will decrease pain to 2/10 or less in his left shoulder while completing work tasks. Long Term Goal 5: Patient will decrease fascial restrictions to trace in his left shoulder region.  Problem List Patient Active Problem List  Diagnosis  . Rotator cuff tear  . AC separation, type 3, left, sequela  . S/P arthroscopy of shoulder  . Articular cartilage disorder, shoulder region  . Disorders of bursae and tendons in shoulder region, unspecified  . Primary localized  osteoarthrosis, shoulder region  . Pain in joint, shoulder region  . Muscle weakness (generalized)    End of Session Activity Tolerance: Patient tolerated treatment well General Behavior During Session: [facility] for tasks performed  GO    Shirlean Mylar, OTR/L  03/19/2012, 12:21 PM

## 2012-03-23 ENCOUNTER — Ambulatory Visit (HOSPITAL_COMMUNITY)
Admission: RE | Admit: 2012-03-23 | Discharge: 2012-03-23 | Disposition: A | Discharge status: Home / Self Care | Payer: Private Health Insurance - Indemnity | Source: Ambulatory Visit | Attending: Pulmonary Disease | Admitting: Pulmonary Disease

## 2012-03-23 ENCOUNTER — Ambulatory Visit (HOSPITAL_COMMUNITY): Payer: Private Health Insurance - Indemnity | Admitting: Specialist

## 2012-03-23 DIAGNOSIS — M6281 Muscle weakness (generalized): Secondary | ICD-10-CM

## 2012-03-23 DIAGNOSIS — M25519 Pain in unspecified shoulder: Secondary | ICD-10-CM

## 2012-03-23 NOTE — Progress Notes (Signed)
Occupational Therapy Treatment Patient Details  Name: Gregory Marsh MRN: [id number] Date of Birth: 06-Jun-1966  Today's Date: 03/23/2012 Time: 4098-1191 OT Time Calculation (min): 72 min Manual Therapy [phone] 20' Therapeutic Exercises [id number] 58'  Visit#: 24  of 36   Re-eval: 04/12/12    Authorization: No authorization required  Subjective S:  I tried laying on it last night, but it really hurt.   Pain Assessment Currently in Pain?: No/denies Pain Score: 0-No pain  Precautions/Restrictions   Progress as tolerated  Exercise/Treatments Supine Protraction: PROM;10 reps;Strengthening;12 reps Protraction Weight (lbs): 1 Horizontal ABduction: PROM;10 reps;Strengthening;12 reps Horizontal ABduction Weight (lbs): 1 External Rotation: PROM;10 reps;Strengthening;12 reps External Rotation Weight (lbs): 1 Internal Rotation: PROM;10 reps;Strengthening;12 reps Internal Rotation Weight (lbs): 1 Flexion: PROM;10 reps;Strengthening;12 reps Shoulder Flexion Weight (lbs): 1 ABduction: PROM;10 reps;Strengthening;12 reps Shoulder ABduction Weight (lbs): 1 Seated Extension: Theraband;15 reps (green) Retraction: Theraband;15 reps (green) Row: Theraband;15 reps (green) Protraction: AROM;15 reps Horizontal ABduction: AROM;15 reps External Rotation: AROM;Theraband;15 reps (green) Internal Rotation: AROM;Theraband;15 reps (green) Flexion: AROM;15 reps Abduction: AROM;15 reps Therapy Ball Right/Left: Other (comment) (dc) ROM / Strengthening / Isometric Strengthening UBE (Upper Arm Bike): 3' forward, 3' backwards 2.0 Wall Wash: 3' with 1# Thumb Tacks: dc Over Head Lace: 2' with 1# "W" Arms: x 15 reps X to V Arms: x 15 reps Proximal Shoulder Strengthening, Seated: dc      Manual Therapy Manual Therapy: Myofascial release Myofascial Release: MFR and manual stretching to left scapular, trapezius, upper arm, posterior and anterior shoulder region to decrease fascial restrictions and  increase pain free AROM  [phone]  Occupational Therapy Assessment and Plan OT Assessment and Plan Clinical Impression Statement: A:  Full AROM in seated this date with minimal pain at end range.  Added overhead lace for sustained activity tolerance with overhead activities.   OT Plan: P:  Increase to 15 reps with strengthening in supine, begin strengthening in seated.  Add weight to x to v and w arms.     Goals Short Term Goals Time to Complete Short Term Goals: 6 weeks Short Term Goal 1: Patient will be educated on HEP. Short Term Goal 2: Patient will increase left shoulder PROM to [facility] for increased ability to reach overhead at work. 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 Short Term Goal 4: Patient will decrease pain to 5/10 in his left shoulder when reaching to shoulder height at work. Short Term Goal 5: Patient will decrease fascial restrictions from moderate to min-mod in his left shoulder. Long Term Goals Time to Complete Long Term Goals: 12 weeks Long Term Goal 1: Patient will return to prior level of I with all B/IADLs, work, and leisure activities. Long Term Goal 1 Progress: Progressing toward goal Long Term Goal 2: Patient will increase left shoulder AROM to WNL for increased ability to complete job tasks that involve reaching overhead. Long Term Goal 2 Progress: Progressing toward goal Long Term Goal 3: Patient will increase his left shoulder strength to 5/5 for increased ability to push and pull equipment at work. Long Term Goal 3 Progress: Progressing toward goal Long Term Goal 4: Patient will decrease pain to 2/10 or less in his left shoulder while completing work tasks. Long Term Goal 4 Progress: Progressing toward goal Long Term Goal 5: Patient will decrease fascial restrictions to trace in his left shoulder region. Long Term Goal 5 Progress: Progressing toward goal  Problem List Patient  Active  Problem List  Diagnosis  . Rotator cuff tear  . AC separation, type 3, left, sequela  . S/P arthroscopy of shoulder  . Articular cartilage disorder, shoulder region  . Disorders of bursae and tendons in shoulder region, unspecified  . Primary localized osteoarthrosis, shoulder region  . Pain in joint, shoulder region  . Muscle weakness (generalized)    End of Session Activity Tolerance: Patient tolerated treatment well General Behavior During Session: [facility] for tasks performed Cognition: [facility] for tasks performed  GO    Shirlean Mylar, OTR/L  03/23/2012, 10:04 AM

## 2012-03-25 ENCOUNTER — Ambulatory Visit (HOSPITAL_COMMUNITY)
Admission: RE | Admit: 2012-03-25 | Discharge: 2012-03-25 | Disposition: A | Discharge status: Home / Self Care | Payer: Private Health Insurance - Indemnity | Source: Ambulatory Visit | Attending: Pulmonary Disease | Admitting: Pulmonary Disease

## 2012-03-25 DIAGNOSIS — M6281 Muscle weakness (generalized): Secondary | ICD-10-CM

## 2012-03-25 DIAGNOSIS — M25519 Pain in unspecified shoulder: Secondary | ICD-10-CM

## 2012-03-25 NOTE — Progress Notes (Signed)
Occupational Therapy Treatment Patient Details  Name: Gregory Marsh MRN: [id number] Date of Birth: 1966/07/30  Today's Date: 03/25/2012 Time: 1024-1124 OT Time Calculation (min): 60 min Manual Therapy [phone]  Therapeutic Exercises 4098-1191 Visit#: 25  of 36   Re-eval: 04/12/12    Subjective Symptoms/Limitations Symptoms: S:  I still can't sleep on my left side, it lets me know I am laying on it. Pain Assessment Currently in Pain?: No/denies Pain Score: 0-No pain  Precautions/Restrictions   Progress as tolerated  Exercise/Treatments Supine Protraction: PROM;10 reps;Strengthening;15 reps Protraction Weight (lbs): 1 Horizontal ABduction: PROM;10 reps;Strengthening;15 reps Horizontal ABduction Weight (lbs): 1 External Rotation: PROM;10 reps;Strengthening;15 reps External Rotation Weight (lbs): 1 Internal Rotation: PROM;10 reps;Strengthening;15 reps Internal Rotation Weight (lbs): 1 Flexion: PROM;10 reps;Strengthening;15 reps Shoulder Flexion Weight (lbs): 1 ABduction: PROM;10 reps;Strengthening;15 reps Shoulder ABduction Weight (lbs): 1 Seated Extension: Other (comment) (dc all tband exercises to HEP) Protraction: Strengthening;10 reps Protraction Weight (lbs): 1 Horizontal ABduction: Strengthening;10 reps Horizontal ABduction Weight (lbs): 1 External Rotation: Strengthening;10 reps External Rotation Weight (lbs): 1 Internal Rotation: Strengthening;10 reps Internal Rotation Weight (lbs): 1 Flexion: Strengthening;10 reps Flexion Weight (lbs): 1 Abduction: Strengthening;10 reps ABduction Weight (lbs): 1 ROM / Strengthening / Isometric Strengthening UBE (Upper Arm Bike): 3' forward, 3' backwards 2.0 Wall Wash: 4' with 1# Over Head Lace: 3' with 1# "W" Arms: 10 reps with 1# X to V Arms: 10 reps with 1#      Manual Therapy Myofascial Release: MFR and manual stretching to left scapular, trapezius, upper arm, posterior and anterior shoulder region to decrease  fascial restrictions and increase pain free AROM [phone]  Min fascial rstrictions noted this date.  Occupational Therapy Assessment and Plan OT Assessment and Plan Clinical Impression Statement: A:  Increased to strengtheningng with seated exercises.  Minimal pain in lateral shoulder with abduction. OT Plan: P:  Add cybex press and row, increase weight with wall wash and overhead lace.   Goals Short Term Goals Time to Complete Short Term Goals: 6 weeks Short Term Goal 1: Patient will be educated on HEP. Short Term Goal 2: Patient will increase left shoulder PROM to [facility] for increased ability to reach overhead at work. 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 Short Term Goal 4: Patient will decrease pain to 5/10 in his left shoulder when reaching to shoulder height at work. Short Term Goal 5: Patient will decrease fascial restrictions from moderate to min-mod in his left shoulder. Long Term Goals Time to Complete Long Term Goals: 12 weeks Long Term Goal 1: Patient will return to prior level of I with all B/IADLs, work, and leisure activities. Long Term Goal 2: Patient will increase left shoulder AROM to WNL for increased ability to complete job tasks that involve reaching overhead. Long Term Goal 3: Patient will increase his left shoulder strength to 5/5 for increased ability to push and pull equipment at work. Long Term Goal 4: Patient will decrease pain to 2/10 or less in his left shoulder while completing work tasks. Long Term Goal 5: Patient will decrease fascial restrictions to trace in his left shoulder region.  Problem List Patient Active Problem List  Diagnosis  . Rotator cuff tear  . AC separation, type 3, left, sequela  . S/P arthroscopy of shoulder  . Articular cartilage disorder, shoulder region  . Disorders of bursae and tendons in shoulder region, unspecified  . Primary localized osteoarthrosis,  shoulder region  .  Pain in joint, shoulder region  . Muscle weakness (generalized)    End of Session Activity Tolerance: Patient tolerated treatment well General Behavior During Session: [facility] for tasks performed Cognition: [facility] for tasks performed OT Plan of Care OT Home Exercise Plan: Issued theraband scapular stability exercises to HEP.  GO    Shirlean Mylar, OTR/L  03/25/2012, 12:02 PM

## 2012-03-29 ENCOUNTER — Ambulatory Visit (HOSPITAL_COMMUNITY): Payer: Private Health Insurance - Indemnity | Admitting: Specialist

## 2012-03-30 ENCOUNTER — Ambulatory Visit (INDEPENDENT_AMBULATORY_CARE_PROVIDER_SITE_OTHER): Payer: Private Health Insurance - Indemnity | Admitting: Orthopedic Surgery

## 2012-03-30 ENCOUNTER — Encounter: Payer: Self-pay | Admitting: Orthopedic Surgery

## 2012-03-30 VITALS — BP 138/88 | Ht 72.0 in | Wt 220.0 lb

## 2012-03-30 DIAGNOSIS — Z9889 Other specified postprocedural states: Secondary | ICD-10-CM

## 2012-03-30 DIAGNOSIS — M24119 Other articular cartilage disorders, unspecified shoulder: Secondary | ICD-10-CM

## 2012-03-30 DIAGNOSIS — M19019 Primary osteoarthritis, unspecified shoulder: Secondary | ICD-10-CM

## 2012-03-30 MED ORDER — OXYCODONE-ACETAMINOPHEN 5-325 MG PO TABS: 1.0000 | ORAL_TABLET | ORAL | Status: AC | PRN

## 2012-03-30 MED ORDER — IBUPROFEN 800 MG PO TABS: 800.0000 mg | ORAL_TABLET | Freq: Three times a day (TID) | ORAL | Status: AC | PRN

## 2012-03-30 NOTE — Progress Notes (Signed)
Patient ID: Gregory Marsh, male   DOB: 1966-06-25, 46 y.o.   MRN: [id number] The patient is now 12 weeks status post removal of the distal clavicle and arthroscopic surgery left shoulder  Is making good progress.  To review of his PT notes indicate adequate progress  He still can't sleep on his left shoulder at night and he has some weakness and poor function of the scapula  His cuff strength is 4/5 in the supraspinatus   Recommend return to work light duty no use of the left upper extremity  Continued physical therapy return in a month.  Continue Percocet 5 mg for pain. Continue ibuprofen 800 mg for pain

## 2012-03-30 NOTE — Patient Instructions (Signed)
Continue therapy   RTW with restrictions on Mon 04-05-2012

## 2012-04-01 ENCOUNTER — Ambulatory Visit (HOSPITAL_COMMUNITY)
Admission: RE | Admit: 2012-04-01 | Discharge: 2012-04-01 | Disposition: A | Discharge status: Home / Self Care | Payer: Private Health Insurance - Indemnity | Source: Ambulatory Visit | Attending: Pulmonary Disease | Admitting: Pulmonary Disease

## 2012-04-01 DIAGNOSIS — M6281 Muscle weakness (generalized): Secondary | ICD-10-CM

## 2012-04-01 DIAGNOSIS — M25519 Pain in unspecified shoulder: Secondary | ICD-10-CM

## 2012-04-01 NOTE — Progress Notes (Signed)
Occupational Therapy Treatment Patient Details  Name: NILES ESS MRN: [id number] Date of Birth: Jun 17, 1966  Today's Date: 04/01/2012 Time: 4098-1191 OT Time Calculation (min): 69 min Manual Therapy [phone] 15' Reassessment [phone] Therapeutic Exercises [phone] 40' Visit#: 26  of 36   Re-eval: 04/29/12    Authorization: No authorization required    Subjective S:  I went to the MD, he said I can go back to work on light duty on 04/05/12.   Special Tests: UEFI was 62% on 03/15/12 current is 71% Pain Assessment Currently in Pain?: Yes Pain Score:   2 Pain Location: Shoulder Pain Orientation: Left Pain Type: Acute pain  Precautions/Restrictions   Progress as tolerated   Exercise/Treatments Supine Protraction: PROM;Strengthening;10 reps Protraction Weight (lbs): 2 Horizontal ABduction: PROM;Strengthening;10 reps Horizontal ABduction Weight (lbs): 2 External Rotation: PROM;Strengthening;10 reps External Rotation Weight (lbs): 2 Internal Rotation: PROM;Strengthening;10 reps Internal Rotation Weight (lbs): 2 Flexion: PROM;Strengthening;10 reps Shoulder Flexion Weight (lbs): 2 ABduction: PROM;Strengthening;10 reps Shoulder ABduction Weight (lbs): 2 Seated Protraction: Strengthening;10 reps Protraction Weight (lbs): 2 Horizontal ABduction: Strengthening;10 reps Horizontal ABduction Weight (lbs): 2 External Rotation: Strengthening;10 reps External Rotation Weight (lbs): 2 Internal Rotation: Strengthening;10 reps Internal Rotation Weight (lbs): 2 Flexion: Strengthening;10 reps Flexion Weight (lbs): 2 Abduction: Strengthening;10 reps ABduction Weight (lbs): 2 Prone  Other Prone Exercises: prone on therapy ball, I T Y x 10 reps with 2# ROM / Strengthening / Isometric Strengthening UBE (Upper Arm Bike): 3' forward, 3' backwards 2.0 Cybex Press: 1.5 plate;15 reps Cybex Row: 1.5 plate;15 reps Wall Wash: omit and replaced with scapular strengthening exercise Over Head  Lace: omit and replace with scapular strengthening exercise "W" Arms: 10 reps with 2# X to V Arms: 10 reps with 2# Proximal Shoulder Strengthening, Seated: 10 reps with 2# without rest to build scapular stability      Manual Therapy Manual Therapy: Myofascial release Myofascial Release: MFR and manual stretching to left scapular, trapezius, upper arm, posterior and anterior shoulder region to decrease fascial restrictions and increase pain free AROM [phone]  Occupational Therapy Assessment and Plan OT Assessment and Plan Clinical Impression Statement: A: Please refer to monthly progress note. Shifted treatment focus to scapular strengthening and stability exercises. Required facilitation with prone exercises for technique and vg to breathe.  OT Frequency: Min 3X/week OT Duration: 4 weeks OT Plan: P: Increase reps with strengthening exercises. Decrease amount of facilitation required with prone therapy ball exercises.   Goals Short Term Goals Time to Complete Short Term Goals: 6 weeks Short Term Goal 1: Patient will be educated on HEP. Short Term Goal 1 Progress: Met Short Term Goal 2: Patient will increase left shoulder PROM to [facility] for increased ability to reach overhead at work. 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 Short Term Goal 3 Progress: Met Short Term Goal 4: Patient will decrease pain to 5/10 in his left shoulder when reaching to shoulder height at work. Short Term Goal 4 Progress: Met Short Term Goal 5: Patient will decrease fascial restrictions from moderate to min-mod in his left shoulder. Short Term Goal 5 Progress: Met Long Term Goals Time to Complete Long Term Goals: 12 weeks Long Term Goal 1: Patient will return to prior level of I with all B/IADLs, work, and leisure activities. Long Term Goal 1 Progress: Progressing toward goal Long Term Goal 2: Patient  will increase left shoulder AROM to WNL for  increased ability to complete job tasks that involve reaching overhead. Long Term Goal 2 Progress: Partly met Long Term Goal 3: Patient will increase his left shoulder strength to 5/5 for increased ability to push and pull equipment at work. Long Term Goal 3 Progress: Progressing toward goal Long Term Goal 4: Patient will decrease pain to 2/10 or less in his left shoulder while completing work tasks. Long Term Goal 4 Progress: Progressing toward goal Long Term Goal 5: Patient will decrease fascial restrictions to trace in his left shoulder region. Long Term Goal 5 Progress: Progressing toward goal Additional Long Term Goals?: Yes Long Term Goal 6: New goal 04/01/12:  Patient will increase external rotation AROM to 70 or more for increased ability to reach behind his head or seat of car.    Problem List Patient Active Problem List  Diagnosis  . Rotator cuff tear  . AC separation, type 3, left, sequela  . S/P arthroscopy of shoulder  . Articular cartilage disorder, shoulder region  . Disorders of bursae and tendons in shoulder region, unspecified  . Primary localized osteoarthrosis, shoulder region  . Pain in joint, shoulder region  . Muscle weakness (generalized)    End of Session Activity Tolerance: Patient tolerated treatment well General Behavior During Session: [facility] for tasks performed Cognition: [facility] for tasks performed   Shirlean Mylar, OTR/L  04/01/2012, 11:01 AM

## 2012-04-02 ENCOUNTER — Ambulatory Visit (HOSPITAL_COMMUNITY)
Admission: RE | Admit: 2012-04-02 | Discharge: 2012-04-02 | Disposition: A | Discharge status: Home / Self Care | Payer: Private Health Insurance - Indemnity | Source: Ambulatory Visit | Attending: Pulmonary Disease | Admitting: Pulmonary Disease

## 2012-04-02 DIAGNOSIS — M25519 Pain in unspecified shoulder: Secondary | ICD-10-CM

## 2012-04-02 DIAGNOSIS — M6281 Muscle weakness (generalized): Secondary | ICD-10-CM

## 2012-04-02 NOTE — Progress Notes (Signed)
Occupational Therapy Treatment Patient Details  Name: Gregory Marsh MRN: [id number] Date of Birth: November 12, 1965  Today's Date: 04/02/2012 Time: 4098-1191 OT Time Calculation (min): 51 min Manual Therapy [phone] 14' Therapeutic Exercise [phone] 36'  Visit#: 27  of 36   Re-eval: 04/29/12 Assessment Diagnosis: S/P Left SAD and Arthroscopic Acromioplasty Surgical Date: 01/09/12   Subjective Symptoms/Limitations Symptoms: S:  I start back to work on Monday.  Precautions/Restrictions  Precautions Precautions: Shoulder Type of Shoulder Precautions: PROM x 6 weeks 02/20/12  Exercise/Treatments Supine Protraction: PROM;Strengthening;12 reps Protraction Weight (lbs): 2 Horizontal ABduction: PROM;Strengthening;12 reps Horizontal ABduction Weight (lbs): 2 External Rotation: PROM;Strengthening;12 reps External Rotation Weight (lbs): 2 Internal Rotation: PROM;Strengthening;12 reps Internal Rotation Weight (lbs): 2 Flexion: PROM;Strengthening;12 reps Shoulder Flexion Weight (lbs): 2 ABduction: PROM;Strengthening;12 reps Shoulder ABduction Weight (lbs): 2 Seated Protraction: Strengthening;12 reps Protraction Weight (lbs): 2 Horizontal ABduction: Strengthening;12 reps Horizontal ABduction Weight (lbs): 2 External Rotation: Strengthening;12 reps External Rotation Weight (lbs): 2 Internal Rotation: Strengthening;12 reps Internal Rotation Weight (lbs): 2 Flexion: Strengthening;12 reps Flexion Weight (lbs): 2 Abduction: Strengthening;12 reps ABduction Weight (lbs): 2 Prone  Other Prone Exercises: prone on therapy ball, I T Y x 12 reps with 2# ROM / Strengthening / Isometric Strengthening UBE (Upper Arm Bike): 3' forward, 3' backwards 2.0 Cybex Press: 2 plate;15 reps Cybex Row: 2 plate;15 reps "W" Arms: 12 reps with 2# X to V Arms: 12 reps with 2# Proximal Shoulder Strengthening, Seated: 15 reps with 2# without rest to build scapular stability      Manual Therapy Manual  Therapy: Myofascial release Myofascial Release: MFR and manual stretching to left scapular, trapezius, upper arm, posterior and anterior shoulder region to decrease fascial restrictions and increase pain free AROM   Occupational Therapy Assessment and Plan OT Assessment and Plan Clinical Impression Statement: A: Increased reps with scapular strengthening/stability exercises.  Facilitation not required today with prone exercises after demonstration.  Patient has really good form and required only 2  vc's during strengthening to keep shoulder depressed. OT Plan: P: Continue to increase scapular stability to be able to return to full job duties. Increase proximal shoulder strengthening to 20 without rest break, cybex by 1/2 plate and bike to 3.0 Patient to call and set up schedule when he finds out his return to work hours   Goals Short Term Goals Time to Complete Short Term Goals: 6 weeks Short Term Goal 1: Patient will be educated on HEP. Short Term Goal 2: Patient will increase left shoulder PROM to [facility] for increased ability to reach overhead at work. 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 Short Term Goal 4: Patient will decrease pain to 5/10 in his left shoulder when reaching to shoulder height at work. Short Term Goal 5: Patient will decrease fascial restrictions from moderate to min-mod in his left shoulder. Long Term Goals Time to Complete Long Term Goals: 12 weeks Long Term Goal 1: Patient will return to prior level of I with all B/IADLs, work, and leisure activities. Long Term Goal 2: Patient will increase left shoulder AROM to WNL for increased ability to complete job tasks that involve reaching overhead. Long Term Goal 3: Patient will increase his left shoulder strength to 5/5 for increased ability to push and pull equipment at work. Long Term Goal 4: Patient will decrease pain to 2/10 or less in his left  shoulder while completing work tasks. Long Term Goal 5: Patient will  decrease fascial restrictions to trace in his left shoulder region. Additional Long Term Goals?: Yes Long Term Goal 6: New goal 04/01/12:  Patient will increase external rotation AROM to 70 or more for increased ability to reach behind his head or seat of car.    Problem List Patient Active Problem List  Diagnosis  . Rotator cuff tear  . AC separation, type 3, left, sequela  . S/P arthroscopy of shoulder  . Articular cartilage disorder, shoulder region  . Disorders of bursae and tendons in shoulder region, unspecified  . Primary localized osteoarthrosis, shoulder region  . Pain in joint, shoulder region  . Muscle weakness (generalized)    End of Session Activity Tolerance: Patient tolerated treatment well General Behavior During Session: [facility] for tasks performed Cognition: [facility] for tasks performed  GO    Noralee Stain, [name] 04/02/2012, 9:56 AM

## 2012-04-14 ENCOUNTER — Ambulatory Visit (HOSPITAL_COMMUNITY)
Admission: RE | Admit: 2012-04-14 | Discharge: 2012-04-14 | Disposition: A | Discharge status: Home / Self Care | Payer: Private Health Insurance - Indemnity | Source: Ambulatory Visit | Attending: Pulmonary Disease | Admitting: Pulmonary Disease

## 2012-04-14 DIAGNOSIS — IMO0001 Reserved for inherently not codable concepts without codable children: Secondary | ICD-10-CM | POA: Insufficient documentation

## 2012-04-14 DIAGNOSIS — M6281 Muscle weakness (generalized): Secondary | ICD-10-CM | POA: Insufficient documentation

## 2012-04-14 DIAGNOSIS — M25619 Stiffness of unspecified shoulder, not elsewhere classified: Secondary | ICD-10-CM | POA: Insufficient documentation

## 2012-04-14 DIAGNOSIS — M25519 Pain in unspecified shoulder: Secondary | ICD-10-CM | POA: Insufficient documentation

## 2012-04-14 NOTE — Progress Notes (Signed)
Occupational Therapy Treatment Patient Details  Name: Gregory Marsh MRN: [id number] Date of Birth: 02-10-1966  Today's Date: 04/14/2012 Time: 4098-1191 OT Time Calculation (min): 47 min Manual therapy [phone] 15' Therapeutic exercises [phone] 32' Visit#: 28  of 36   Re-eval: 04/29/12    Authorization: workers compensation - no authorization required   Subjective S:  I tried to go back to work last week, but they didnt have any light duty for me.   Pain Assessment Currently in Pain?: Yes Pain Score:   1 Pain Location: Shoulder Pain Orientation: Left Pain Type: Acute pain  Precautions/Restrictions   Progress as tolerated  Exercise/Treatments Supine Protraction: PROM;5 reps Horizontal ABduction: PROM;5 reps External Rotation: PROM;5 reps Internal Rotation: PROM;5 reps Flexion: PROM;5 reps ABduction: PROM;5 reps Seated Protraction: Strengthening;10 reps Protraction Weight (lbs): 3 Horizontal ABduction: Strengthening;10 reps Horizontal ABduction Weight (lbs): 3 External Rotation: Strengthening;10 reps External Rotation Weight (lbs): 3 Internal Rotation: Strengthening;10 reps Internal Rotation Weight (lbs): 3 Flexion: Strengthening;10 reps Flexion Weight (lbs): 3 Abduction: Strengthening;10 reps ABduction Weight (lbs): 3 Prone  Other Prone Exercises: prone on therapy ball, I T Y x 10 reps with 3# ROM / Strengthening / Isometric Strengthening UBE (Upper Arm Bike): 3' forward, 3' backwards 2.5 Cybex Press: 2.5 plate;15 reps Cybex Row: 2.5 plate;15 reps Wall Wash: dc Over Head Lace: 2' with3# Wall Pushups: 10 reps "W" Arms: 10 reps with 3# X to V Arms: 10 reps with 3# Proximal Shoulder Strengthening, Seated: resume next visit      Manual Therapy Manual Therapy: Myofascial release Myofascial Release: MFR and manual stretching to left scapular, trapezius, upper arm, posterior and anterior shoulder region to decrease fascial restrictions and increase pain free AROM  [phone]  Occupational Therapy Assessment and Plan OT Assessment and Plan Clinical Impression Statement: A:  DC supine strengthening in order to focus on work hardening exercises.  Added wall pushups and increased to 3# with strengthening.  Patient's shoulder fatiguing out at rep 9 of strengthening exercises, but able to complete one set of 10. OT Plan: P:  Add box lift floor to waist and waist to shoulder exercises.  Remain at 10 reps with strengthening exercises.   Goals Short Term Goals Time to Complete Short Term Goals: 6 weeks Short Term Goal 1: Patient will be educated on HEP. Short Term Goal 2: Patient will increase left shoulder PROM to [facility] for increased ability to reach overhead at work. 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 Short Term Goal 4: Patient will decrease pain to 5/10 in his left shoulder when reaching to shoulder height at work. Short Term Goal 5: Patient will decrease fascial restrictions from moderate to min-mod in his left shoulder. Long Term Goals Time to Complete Long Term Goals: 12 weeks Long Term Goal 1: Patient will return to prior level of I with all B/IADLs, work, and leisure activities. Long Term Goal 1 Progress: Progressing toward goal Long Term Goal 2: Patient will increase left shoulder AROM to WNL for increased ability to complete job tasks that involve reaching overhead. Long Term Goal 2 Progress: Progressing toward goal Long Term Goal 3: Patient will increase his left shoulder strength to 5/5 for increased ability to push and pull equipment at work. Long Term Goal 3 Progress: Progressing toward goal Long Term Goal 4: Patient will decrease pain to 2/10 or less in his left shoulder while completing work tasks. Long Term Goal 4 Progress:  Progressing toward goal Long Term Goal 5: Patient will decrease fascial restrictions to trace in his left shoulder region. Long Term Goal 5  Progress: Progressing toward goal Additional Long Term Goals?: Yes Long Term Goal 6: New goal 04/01/12:  Patient will increase external rotation AROM to 70 or more for increased ability to reach behind his head or seat of car.   Long Term Goal 6 Progress: Progressing toward goal  Problem List Patient Active Problem List  Diagnosis  . Rotator cuff tear  . AC separation, type 3, left, sequela  . S/P arthroscopy of shoulder  . Articular cartilage disorder, shoulder region  . Disorders of bursae and tendons in shoulder region, unspecified  . Primary localized osteoarthrosis, shoulder region  . Pain in joint, shoulder region  . Muscle weakness (generalized)    End of Session Activity Tolerance: Patient tolerated treatment well General Behavior During Session: [facility] for tasks performed Cognition: [facility] for tasks performed  GO    Shirlean Mylar, OTR/L  04/14/2012, 8:54 AM

## 2012-04-16 ENCOUNTER — Ambulatory Visit (HOSPITAL_COMMUNITY)
Admission: RE | Admit: 2012-04-16 | Discharge: 2012-04-16 | Disposition: A | Discharge status: Home / Self Care | Payer: Private Health Insurance - Indemnity | Source: Ambulatory Visit | Attending: Pulmonary Disease | Admitting: Pulmonary Disease

## 2012-04-16 DIAGNOSIS — M25519 Pain in unspecified shoulder: Secondary | ICD-10-CM

## 2012-04-16 DIAGNOSIS — M6281 Muscle weakness (generalized): Secondary | ICD-10-CM

## 2012-04-16 NOTE — Progress Notes (Signed)
Occupational Therapy Treatment Patient Details  Name: Gregory Marsh MRN: [id number] Date of Birth: Oct 05, 1965  Today's Date: 04/16/2012 Time: 0806-0910 OT Time Calculation (min): 64 min Manual Therapy [phone] 18' Therapeutic Exercises 824-910 48' Visit#: 50  of 36   Re-eval: 04/29/12    Authorization: workers compensation - no authorization required   Subjective S:  My arm was a little sore after therapy the other day, muscle soreness like I worked out.   Pain Assessment Currently in Pain?: Yes Pain Score:   1 Pain Location: Shoulder Pain Orientation: Left Pain Type: Acute pain  Precautions/Restrictions   Progress as tolerated   Exercise/Treatments Supine Other Supine Exercises: serratus anterior punch with 3# x 15 reps Seated Protraction: Strengthening;10 reps Protraction Weight (lbs): 3 Horizontal ABduction: Strengthening;10 reps Horizontal ABduction Weight (lbs): 3 External Rotation: Strengthening;10 reps External Rotation Weight (lbs): 3 Internal Rotation: Strengthening;10 reps Internal Rotation Weight (lbs): 3 Flexion: Strengthening;10 reps Flexion Weight (lbs): 3 Abduction: Strengthening;10 reps ABduction Weight (lbs): 3 Prone  Other Prone Exercises: prone on therapy ball, I T Y x 10 reps with 3# ROM / Strengthening / Isometric Strengthening UBE (Upper Arm Bike): 3' forward, 3' backwards 3.0 Cybex Press: 2.5 plate;15 reps Cybex Row: 2.5 plate;15 reps Over Head Lace: 2' with3# Wall Pushups: 15 reps "W" Arms: 10 reps with 3# X to V Arms: 10 reps with 3# Proximal Shoulder Strengthening, Seated: 10 reps with 3# without rest Other ROM/Strengthening Exercises: begin box lift from floor to waist and waist to shoulder next visit      Manual Therapy Manual Therapy: Myofascial release Myofascial Release: MFR and manual stretching to left scapular, trapezius, upper arm, posterior and anterior shoulder region to decrease fascial restrictions and increase pain free  AROM [phone]  Occupational Therapy Assessment and Plan OT Assessment and Plan Clinical Impression Statement: A:  Less pain with strengthening exercises, able to complete 10 reps without fatiguing. OT Plan: P:  Add box lift floor to waist and waist to shoulder.  Increase to 12 reps with strengthening exercises.     Goals Short Term Goals Time to Complete Short Term Goals: 6 weeks Short Term Goal 1: Patient will be educated on HEP. Short Term Goal 2: Patient will increase left shoulder PROM to [facility] for increased ability to reach overhead at work. 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 Short Term Goal 4: Patient will decrease pain to 5/10 in his left shoulder when reaching to shoulder height at work. Short Term Goal 5: Patient will decrease fascial restrictions from moderate to min-mod in his left shoulder. Long Term Goals Time to Complete Long Term Goals: 12 weeks Long Term Goal 1: Patient will return to prior level of I with all B/IADLs, work, and leisure activities. Long Term Goal 2: Patient will increase left shoulder AROM to WNL for increased ability to complete job tasks that involve reaching overhead. Long Term Goal 3: Patient will increase his left shoulder strength to 5/5 for increased ability to push and pull equipment at work. Long Term Goal 4: Patient will decrease pain to 2/10 or less in his left shoulder while completing work tasks. Long Term Goal 5: Patient will decrease fascial restrictions to trace in his left shoulder region. Additional Long Term Goals?: Yes Long Term Goal 6: New goal 04/01/12:  Patient will increase external rotation AROM to 70 or more for increased ability to reach behind his head or seat of  car.    Problem List Patient Active Problem List  Diagnosis  . Rotator cuff tear  . AC separation, type 3, left, sequela  . S/P arthroscopy of shoulder  . Articular cartilage disorder,  shoulder region  . Disorders of bursae and tendons in shoulder region, unspecified  . Primary localized osteoarthrosis, shoulder region  . Pain in joint, shoulder region  . Muscle weakness (generalized)    End of Session Activity Tolerance: Patient tolerated treatment well General Behavior During Session: [facility] for tasks performed Cognition: [facility] for tasks performed  GO    Shirlean Mylar, OTR/L  04/16/2012, 9:23 AM

## 2012-04-19 ENCOUNTER — Ambulatory Visit (HOSPITAL_COMMUNITY)
Admission: RE | Admit: 2012-04-19 | Discharge: 2012-04-19 | Disposition: A | Discharge status: Home / Self Care | Payer: Private Health Insurance - Indemnity | Source: Ambulatory Visit | Attending: Pulmonary Disease | Admitting: Pulmonary Disease

## 2012-04-19 DIAGNOSIS — M6281 Muscle weakness (generalized): Secondary | ICD-10-CM

## 2012-04-19 DIAGNOSIS — M25519 Pain in unspecified shoulder: Secondary | ICD-10-CM

## 2012-04-19 NOTE — Progress Notes (Signed)
Occupational Therapy Treatment Patient Details  Name: Gregory Marsh MRN: [id number] Date of Birth: 1966-02-09  Today's Date: 04/19/2012 Time: 0850-1001 OT Time Calculation (min): 71 min Manual Therapy [phone] 20' Therapeutic Exercises [phone] 60'  Visit#: 30  of 36   Re-eval: 04/29/12    Authorization: workers compensation - no authorization required    Subjective S:  I still feel a little twinge in my shoulder sometimes, and I cant sleep on it.  Pain Assessment Currently in Pain?: Yes Pain Score:   1 Pain Location: Shoulder Pain Orientation: Left Pain Type: Acute pain  Precautions/Restrictions   progress as tolerated  Exercise/Treatments Supine Protraction: PROM;5 reps Horizontal ABduction: PROM;5 reps External Rotation: PROM;5 reps Internal Rotation: PROM;5 reps Flexion: PROM;5 reps ABduction: PROM;5 reps Other Supine Exercises: serratus anterior punch with 3# x 15 reps Seated Protraction: Strengthening;12 reps Protraction Weight (lbs): 3 Horizontal ABduction: Strengthening;12 reps Horizontal ABduction Weight (lbs): 3 External Rotation: Strengthening;12 reps External Rotation Weight (lbs): 3 Internal Rotation: Strengthening;12 reps Internal Rotation Weight (lbs): 3 Flexion: Strengthening;12 reps Flexion Weight (lbs): 3 Abduction: Strengthening;12 reps ABduction Weight (lbs): 3 Prone  Other Prone Exercises: prone on therapy ball, I T Y x 12 reps with 3# ROM / Strengthening / Isometric Strengthening UBE (Upper Arm Bike): 3' forward, 3' backwards 3.0 Cybex Press: 2.5 plate;20 reps Cybex Row: 2.5 plate;20 reps Over Head Lace: 3' with 3# Wall Pushups: 20 reps "W" Arms: 12 reps with 3# X to V Arms: 12 reps with 3# Proximal Shoulder Strengthening, Seated: dc Other ROM/Strengthening Exercises:  box lift from floor to waist and waist to shoulder empty box x 10 reps      Manual Therapy Manual Therapy: Myofascial release Myofascial Release: MFR and manual  stretching to left scapular, trapezius, upper arm, posterior and anterior shoulder region, cross hand release to pectoral region, and anterior lateral shoulder joint to decrease fascial restrictions and increase pain free AROM  [id number]  Occupational Therapy Assessment and Plan OT Assessment and Plan Clinical Impression Statement: A:  Added cross hand release to pec area.  Increased to 12 reps with strengthening exercises for greater challenge and added strength, added work hardening exercises. OT Plan: P:  Add weight to work hardening exercises, as tolerated.     Goals Short Term Goals Time to Complete Short Term Goals: 6 weeks Short Term Goal 1: Patient will be educated on HEP. Short Term Goal 2: Patient will increase left shoulder PROM to [facility] for increased ability to reach overhead at work. 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 Short Term Goal 4: Patient will decrease pain to 5/10 in his left shoulder when reaching to shoulder height at work. Short Term Goal 5: Patient will decrease fascial restrictions from moderate to min-mod in his left shoulder. Long Term Goals Time to Complete Long Term Goals: 12 weeks Long Term Goal 1: Patient will return to prior level of I with all B/IADLs, work, and leisure activities. Long Term Goal 2: Patient will increase left shoulder AROM to WNL for increased ability to complete job tasks that involve reaching overhead. Long Term Goal 3: Patient will increase his left shoulder strength to 5/5 for increased ability to push and pull equipment at work. Long Term Goal 4: Patient will decrease pain to 2/10 or less in his left shoulder while completing work tasks. Long Term Goal 5: Patient will decrease fascial restrictions to trace in his left shoulder region.  Additional Long Term Goals?: Yes Long Term Goal 6: New goal 04/01/12:  Patient will increase external rotation AROM to 70 or  more for increased ability to reach behind his head or seat of car.    Problem List Patient Active Problem List  Diagnosis  . Rotator cuff tear  . AC separation, type 3, left, sequela  . S/P arthroscopy of shoulder  . Articular cartilage disorder, shoulder region  . Disorders of bursae and tendons in shoulder region, unspecified  . Primary localized osteoarthrosis, shoulder region  . Pain in joint, shoulder region  . Muscle weakness (generalized)    End of Session Activity Tolerance: Patient tolerated treatment well General Behavior During Session: [facility] for tasks performed Cognition: [facility] for tasks performed  GO    Shirlean Mylar, OTR/L  04/19/2012, 11:44 AM

## 2012-04-21 ENCOUNTER — Ambulatory Visit (HOSPITAL_COMMUNITY)
Admission: RE | Admit: 2012-04-21 | Discharge: 2012-04-21 | Disposition: A | Discharge status: Home / Self Care | Payer: Private Health Insurance - Indemnity | Source: Ambulatory Visit | Attending: Pulmonary Disease | Admitting: Pulmonary Disease

## 2012-04-21 DIAGNOSIS — M25519 Pain in unspecified shoulder: Secondary | ICD-10-CM

## 2012-04-21 DIAGNOSIS — M6281 Muscle weakness (generalized): Secondary | ICD-10-CM

## 2012-04-21 NOTE — Progress Notes (Signed)
Occupational Therapy Treatment Patient Details  Name: Gregory Marsh MRN: [id number] Date of Birth: 11/25/1965  Today's Date: 04/21/2012 Time: 4132-4401 OT Time Calculation (min): 56 min Manual Therapy [phone] 16' Therapeutic Exercises [phone] 40' Visit#: 31  of 36   Re-eval: 04/29/12    Authorization: workers compensation - no authorization required    Subjective S:  That was a workout Monday! Pain Assessment Currently in Pain?: Yes Pain Score:   1 Pain Location: Shoulder Pain Orientation: Left Pain Type: Acute pain  Precautions/Restrictions   progress as tolerated  Exercise/Treatments Supine Protraction: PROM;5 reps Horizontal ABduction: PROM;5 reps External Rotation: PROM;5 reps Internal Rotation: PROM;5 reps Flexion: PROM;5 reps ABduction: PROM;5 reps Other Supine Exercises: serratus anterior punch with 3# x 15 reps Seated Protraction: Strengthening;15 reps Protraction Weight (lbs): 3 Horizontal ABduction: Strengthening;15 reps Horizontal ABduction Weight (lbs): 3 External Rotation: Strengthening;15 reps External Rotation Weight (lbs): 3 Internal Rotation: Strengthening;15 reps Internal Rotation Weight (lbs): 3 Flexion: Strengthening;15 reps Flexion Weight (lbs): 3 Abduction: Strengthening;15 reps ABduction Weight (lbs): 3 Prone  Other Prone Exercises: prone on therapy ball, I T Y x 15 reps with 3# ROM / Strengthening / Isometric Strengthening UBE (Upper Arm Bike): 3' and 3' 3.5 Cybex Press: 3 plate;10 reps Cybex Row: 3 plate;10 reps Over Head Lace: hold today due to time, resume next visit Wall Pushups: 20 reps "W" Arms: 15 reps with 3# X to V Arms: 15 reps with 3# Other ROM/Strengthening Exercises:  box lift from floor to waist and waist to shoulder empty box x 10 reps      Manual Therapy Manual Therapy: Myofascial release Myofascial Release: MFR and manual stretching to left scapular, trapezius, upper arm, posterior and anterior shoulder region,  cross hand release to pectoral region, and anterior lateral shoulder joint to decrease fascial restrictions and increase pain free AROM [phone]  Occupational Therapy Assessment and Plan OT Assessment and Plan Clinical Impression Statement: A:  Increased to 15 reps with all seated strengthening exercises.  Residual soreness in shoulder joint continues to be present. OT Plan: P:  Add 5# to work hardening exercises. Remain 15 reps with seated strengthening as this is a good fit for quality/quantity of movement with patient. Transition wall pushup to modified pushup.   Goals Short Term Goals Time to Complete Short Term Goals: 6 weeks Short Term Goal 1: Patient will be educated on HEP. Short Term Goal 2: Patient will increase left shoulder PROM to [facility] for increased ability to reach overhead at work. 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 Short Term Goal 4: Patient will decrease pain to 5/10 in his left shoulder when reaching to shoulder height at work. Short Term Goal 5: Patient will decrease fascial restrictions from moderate to min-mod in his left shoulder. Long Term Goals Time to Complete Long Term Goals: 12 weeks Long Term Goal 1: Patient will return to prior level of I with all B/IADLs, work, and leisure activities. Long Term Goal 2: Patient will increase left shoulder AROM to WNL for increased ability to complete job tasks that involve reaching overhead. Long Term Goal 3: Patient will increase his left shoulder strength to 5/5 for increased ability to push and pull equipment at work. Long Term Goal 4: Patient will decrease pain to 2/10 or less in his left shoulder while completing work tasks. Long Term Goal 5: Patient will decrease fascial restrictions to trace in his left shoulder region. Additional  Long Term Goals?: Yes Long Term Goal 6: New goal 04/01/12:  Patient will increase external rotation AROM to 70 or  more for increased ability to reach behind his head or seat of car.    Problem List Patient Active Problem List  Diagnosis  . Rotator cuff tear  . AC separation, type 3, left, sequela  . S/P arthroscopy of shoulder  . Articular cartilage disorder, shoulder region  . Disorders of bursae and tendons in shoulder region, unspecified  . Primary localized osteoarthrosis, shoulder region  . Pain in joint, shoulder region  . Muscle weakness (generalized)    End of Session Activity Tolerance: Patient tolerated treatment well General Behavior During Session: [facility] for tasks performed Cognition: [facility] for tasks performed  GO    Shirlean Mylar, OTR/L  04/21/2012, 9:47 AM

## 2012-04-23 ENCOUNTER — Ambulatory Visit (HOSPITAL_COMMUNITY)
Admission: RE | Admit: 2012-04-23 | Discharge: 2012-04-23 | Disposition: A | Discharge status: Home / Self Care | Payer: Private Health Insurance - Indemnity | Source: Ambulatory Visit | Attending: Pulmonary Disease | Admitting: Pulmonary Disease

## 2012-04-23 DIAGNOSIS — M25519 Pain in unspecified shoulder: Secondary | ICD-10-CM

## 2012-04-23 DIAGNOSIS — M6281 Muscle weakness (generalized): Secondary | ICD-10-CM

## 2012-04-23 NOTE — Progress Notes (Signed)
Occupational Therapy Treatment Patient Details  Name: Gregory Marsh MRN: [id number] Date of Birth: 12-27-1965  Today's Date: 04/23/2012 Time: 8295-6213 OT Time Calculation (min): 60 min Manual Therapy [phone] 22' Therapeutic Exercises [phone] 73' Visit#: 32  of 36   Re-eval: 04/29/12    Authorization: workers compensation - no authorization required   Authorization Time Period:    Authorization Visit#:   of    Subjective  S:  Im a little sore today.  I had to use some ice.   Pain Assessment Currently in Pain?: Yes Pain Score:   2 Pain Location: Shoulder Pain Orientation: Left Pain Type: Acute pain  Precautions/Restrictions   progress as tolerated  Exercise/Treatments Supine Protraction: PROM;5 reps Horizontal ABduction: PROM;5 reps External Rotation: PROM;5 reps Internal Rotation: PROM;5 reps Flexion: PROM;5 reps ABduction: PROM;5 reps Other Supine Exercises: omit resume next visit Seated Protraction: Strengthening;15 reps Protraction Weight (lbs): 3 Horizontal ABduction: Strengthening;15 reps Horizontal ABduction Weight (lbs): 3 External Rotation: Other (comment) (omit today only) Internal Rotation: Other (comment) (omit today only) Flexion: Strengthening;15 reps Flexion Weight (lbs): 3 Abduction: Strengthening;15 reps ABduction Weight (lbs): 3 Prone  Other Prone Exercises: omit today only ROM / Strengthening / Isometric Strengthening UBE (Upper Arm Bike): 3' and 3' 3.5 Cybex Press: 3 plate;10 reps Cybex Row: 3 plate;10 reps Over Head Lace: 3# x 3' Wall Pushups: 20 reps "W" Arms: 15 reps with 3# X to V Arms: 15 reps with 3# Other ROM/Strengthening Exercises:  box lift from floor to waist and waist to shoulder empty box x 10 reps      Manual Therapy Manual Therapy: Myofascial release Myofascial Release: MFR and manual stretching to left scapular, trapezius, upper arm, posterior and anterior shoulder region, cross hand release to pectoral region, and  anterior lateral shoulder joint to decrease fascial restrictions and increase pain free AROM [phone]  Occupational Therapy Assessment and Plan OT Assessment and Plan Clinical Impression Statement: A:  due to increased muscle soreness held most difficult exercise, prone on ball strengthening.  Did not add weight to work hardening exercises, either OT Plan: P:  Resume prone on ball exercises and add 5# to work hardening exercises.     Goals Short Term Goals Time to Complete Short Term Goals: 6 weeks Short Term Goal 1: Patient will be educated on HEP. Short Term Goal 2: Patient will increase left shoulder PROM to [facility] for increased ability to reach overhead at work. 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 Short Term Goal 4: Patient will decrease pain to 5/10 in his left shoulder when reaching to shoulder height at work. Short Term Goal 5: Patient will decrease fascial restrictions from moderate to min-mod in his left shoulder. Long Term Goals Time to Complete Long Term Goals: 12 weeks Long Term Goal 1: Patient will return to prior level of I with all B/IADLs, work, and leisure activities. Long Term Goal 2: Patient will increase left shoulder AROM to WNL for increased ability to complete job tasks that involve reaching overhead. Long Term Goal 3: Patient will increase his left shoulder strength to 5/5 for increased ability to push and pull equipment at work. Long Term Goal 4: Patient will decrease pain to 2/10 or less in his left shoulder while completing work tasks. Long Term Goal 5: Patient will decrease fascial restrictions to trace in his left shoulder region. Additional Long Term Goals?: Yes Long Term Goal 6: New goal 04/01/12:  Patient will increase external rotation AROM to 70 or more for increased ability to reach behind his head or seat of car.    Problem List Patient Active Problem List  Diagnosis  .  Rotator cuff tear  . AC separation, type 3, left, sequela  . S/P arthroscopy of shoulder  . Articular cartilage disorder, shoulder region  . Disorders of bursae and tendons in shoulder region, unspecified  . Primary localized osteoarthrosis, shoulder region  . Pain in joint, shoulder region  . Muscle weakness (generalized)    End of Session Activity Tolerance: Patient tolerated treatment well General Behavior During Session: [facility] for tasks performed Cognition: [facility] for tasks performed  GO    Shirlean Mylar, OTR/L  04/23/2012, 9:20 AM

## 2012-04-26 ENCOUNTER — Ambulatory Visit (HOSPITAL_COMMUNITY)
Admission: RE | Admit: 2012-04-26 | Discharge: 2012-04-26 | Disposition: A | Discharge status: Home / Self Care | Payer: Private Health Insurance - Indemnity | Source: Ambulatory Visit | Attending: Pulmonary Disease | Admitting: Pulmonary Disease

## 2012-04-26 DIAGNOSIS — M6281 Muscle weakness (generalized): Secondary | ICD-10-CM

## 2012-04-26 DIAGNOSIS — M25519 Pain in unspecified shoulder: Secondary | ICD-10-CM

## 2012-04-26 NOTE — Progress Notes (Signed)
Occupational Therapy Treatment Patient Details  Name: Gregory Marsh MRN: [id number] Date of Birth: 02/28/1966  Today's Date: 04/26/2012 Time: 7829-5621 OT Time Calculation (min): 32 min Manual Therapy 850-902 12' Therapeutic Exercises [phone] 20' Visit#: 40  of 36   Re-eval: 04/29/12    Authorization: workers compensation - no authorization required    Subjective Symptoms/Limitations Symptoms: S:  I can feel a little soreness in the background.  Pain Assessment Currently in Pain?: Yes Pain Score:   2 Pain Location: Shoulder Pain Orientation: Left Pain Type: Acute pain  Precautions/Restrictions   Progress as tolerated   Exercise/Treatments Seated Protraction: Strengthening;15 reps Protraction Weight (lbs): 3 Horizontal ABduction: Strengthening;15 reps Horizontal ABduction Weight (lbs): 3 External Rotation: Strengthening;15 reps External Rotation Weight (lbs): 3 Internal Rotation: Strengthening;15 reps Internal Rotation Weight (lbs): 3 Flexion: Strengthening;15 reps Flexion Weight (lbs): 3 Abduction: Strengthening;15 reps ABduction Weight (lbs): 3 Prone  Other Prone Exercises: omit today due to congestion ROM / Strengthening / Isometric Strengthening UBE (Upper Arm Bike):  (hold today due to patient not feeling well) Cybex Press:  (hold today due to patient not feeling well) Cybex Row:  (hold today due to patient not feeling well) Over Head Lace: 3# x 3' Wall Pushups: 20 reps "W" Arms: 15 reps with 3# X to V Arms: 15 reps with 3# Other ROM/Strengthening Exercises: hold today due to patient not feeling well.        Manual Therapy Manual Therapy: Myofascial release Myofascial Release: In seated, MFR and manual massage to scapular region to decrease restrictions and increase mobility in left shoulder region.  [phone]  Occupational Therapy Assessment and Plan OT Assessment and Plan Clinical Impression Statement: A:  Completed MFR and manual massage in seated vs  supine due to patient request.  Remained at 3# with strengthening, as this amount is still a just right challenge for  Gregory Marsh.   OT Plan: P:  Reassess, resume prone and supine exercises if patients upper respiratory congestion is gone.     Goals Short Term Goals Time to Complete Short Term Goals: 6 weeks Short Term Goal 1: Patient will be educated on HEP. Short Term Goal 2: Patient will increase left shoulder PROM to [facility] for increased ability to reach overhead at work. 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 Short Term Goal 4: Patient will decrease pain to 5/10 in his left shoulder when reaching to shoulder height at work. Short Term Goal 5: Patient will decrease fascial restrictions from moderate to min-mod in his left shoulder. Long Term Goals Time to Complete Long Term Goals: 12 weeks Long Term Goal 1: Patient will return to prior level of I with all B/IADLs, work, and leisure activities. Long Term Goal 2: Patient will increase left shoulder AROM to WNL for increased ability to complete job tasks that involve reaching overhead. Long Term Goal 3: Patient will increase his left shoulder strength to 5/5 for increased ability to push and pull equipment at work. Long Term Goal 4: Patient will decrease pain to 2/10 or less in his left shoulder while completing work tasks. Long Term Goal 5: Patient will decrease fascial restrictions to trace in his left shoulder region. Additional Long Term Goals?: Yes Long Term Goal 6: New goal 04/01/12:  Patient will increase external rotation AROM to 70 or more for increased ability to reach behind his head or seat of car.    Problem List Patient Active  Problem List  Diagnosis  . Rotator cuff tear  . AC separation, type 3, left, sequela  . S/P arthroscopy of shoulder  . Articular cartilage disorder, shoulder region  . Disorders of bursae and tendons in shoulder region,  unspecified  . Primary localized osteoarthrosis, shoulder region  . Pain in joint, shoulder region  . Muscle weakness (generalized)    End of Session Activity Tolerance: Patient tolerated treatment well General Behavior During Session: [facility] for tasks performed Cognition: [facility] for tasks performed  GO    Shirlean Mylar, OTR/L  04/26/2012, 9:24 AM

## 2012-04-28 ENCOUNTER — Ambulatory Visit (HOSPITAL_COMMUNITY)
Admission: RE | Admit: 2012-04-28 | Discharge: 2012-04-28 | Disposition: A | Discharge status: Home / Self Care | Payer: Private Health Insurance - Indemnity | Source: Ambulatory Visit | Attending: Pulmonary Disease | Admitting: Pulmonary Disease

## 2012-04-28 DIAGNOSIS — M25519 Pain in unspecified shoulder: Secondary | ICD-10-CM

## 2012-04-28 DIAGNOSIS — M6281 Muscle weakness (generalized): Secondary | ICD-10-CM

## 2012-04-28 NOTE — Progress Notes (Signed)
Occupational Therapy Treatment Patient Details  Name: KHAMARION BJELLAND MRN: [id number] Date of Birth: 09-03-1965  Today's Date: 04/28/2012 Time: 7829-5621 OT Time Calculation (min): 70 min Manual Therapy [phone]' Therapeutic Exercises [phone] 72' Visit#: 42  of 36   Re-eval: 04/29/12    Authorization: workers compensation - no authorization required   Subjective  S:  I feel a bit better.  It still is just a bit sore in the joint. Pain Assessment Currently in Pain?: Yes Pain Score:   1 Pain Location: Shoulder Pain Orientation: Left Pain Type: Acute pain  Precautions/Restrictions   Progress as tolerated  Exercise/Treatments Supine Protraction: PROM;5 reps Horizontal ABduction: PROM;5 reps External Rotation: PROM;5 reps Internal Rotation: PROM;5 reps Flexion: PROM;5 reps ABduction: PROM;5 reps Other Supine Exercises: omit Seated Protraction: Strengthening;20 reps Protraction Weight (lbs): 3 Horizontal ABduction: Strengthening;20 reps Horizontal ABduction Weight (lbs): 3 External Rotation: Strengthening;20 reps External Rotation Weight (lbs): 3 Internal Rotation: Strengthening;20 reps Internal Rotation Weight (lbs): 3 Flexion: Strengthening;20 reps Flexion Weight (lbs): 3 Abduction: Strengthening;20 reps ABduction Weight (lbs): 3 Prone  Other Prone Exercises: prone on therapy ball I, T, Y 15 reps with 3# ROM / Strengthening / Isometric Strengthening UBE (Upper Arm Bike): 3' and 3' 3.5 Cybex Press: 3 plate;15 reps Cybex Row: 3 plate;15 reps Over Head Lace: resume next visit Wall Pushups: Other (comment) (resume next visit) "W" Arms: 20 reps with 3# X to V Arms: 20 reps with 3# Other ROM/Strengthening Exercises:  box lift from floor to waist and waist to shoulder 5# box x 10 reps      Manual Therapy Manual Therapy: Myofascial release Myofascial Release: In supine, MFR and manual stretching to scapular region, upper arm, and trapezius region to decrease pain and  restrictions and increase pain free mobility.  [phone]  Occupational Therapy Assessment and Plan OT Assessment and Plan Clinical Impression Statement: A:  Increased reps of seated strengthening to 20 reps, patient required several rest breaks, but was able to complete all reps.   OT Plan: P:  REASSESS.   Goals Short Term Goals Time to Complete Short Term Goals: 6 weeks Short Term Goal 1: Patient will be educated on HEP. Short Term Goal 2: Patient will increase left shoulder PROM to [facility] for increased ability to reach overhead at work. 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 Short Term Goal 4: Patient will decrease pain to 5/10 in his left shoulder when reaching to shoulder height at work. Short Term Goal 5: Patient will decrease fascial restrictions from moderate to min-mod in his left shoulder. Long Term Goals Time to Complete Long Term Goals: 12 weeks Long Term Goal 1: Patient will return to prior level of I with all B/IADLs, work, and leisure activities. Long Term Goal 2: Patient will increase left shoulder AROM to WNL for increased ability to complete job tasks that involve reaching overhead. Long Term Goal 3: Patient will increase his left shoulder strength to 5/5 for increased ability to push and pull equipment at work. Long Term Goal 4: Patient will decrease pain to 2/10 or less in his left shoulder while completing work tasks. Long Term Goal 5: Patient will decrease fascial restrictions to trace in his left shoulder region. Additional Long Term Goals?: Yes Long Term Goal 6: New goal 04/01/12:  Patient will increase external rotation AROM to 70 or more for increased ability to reach behind his head or seat of car.  Problem List Patient Active Problem List  Diagnosis  . Rotator cuff tear  . AC separation, type 3, left, sequela  . S/P arthroscopy of shoulder  . Articular cartilage disorder, shoulder  region  . Disorders of bursae and tendons in shoulder region, unspecified  . Primary localized osteoarthrosis, shoulder region  . Pain in joint, shoulder region  . Muscle weakness (generalized)    End of Session Activity Tolerance: Patient tolerated treatment well General Behavior During Session: [facility] for tasks performed Cognition: [facility] for tasks performed  GO    Shirlean Mylar, OTR/L  04/28/2012, 11:35 AM

## 2012-04-30 ENCOUNTER — Ambulatory Visit (HOSPITAL_COMMUNITY)
Admission: RE | Admit: 2012-04-30 | Discharge: 2012-04-30 | Disposition: A | Discharge status: Home / Self Care | Payer: Private Health Insurance - Indemnity | Source: Ambulatory Visit | Attending: Pulmonary Disease | Admitting: Pulmonary Disease

## 2012-04-30 DIAGNOSIS — M6281 Muscle weakness (generalized): Secondary | ICD-10-CM

## 2012-04-30 DIAGNOSIS — M25519 Pain in unspecified shoulder: Secondary | ICD-10-CM

## 2012-04-30 NOTE — Progress Notes (Signed)
Occupational Therapy Treatment Patient Details  Name: Gregory Marsh MRN: [id number] Date of Birth: 01-03-1966  Today's Date: 04/30/2012 Time: 1478-2956 OT Time Calculation (min): 46 min Manual Therapy [phone] 14' Therapeutic Exercises [phone] 23' Visit#: 35  of 40   Re-eval: 05/14/12    Authorization: workers compensation - no authorization required    Subjective Symptoms/Limitations Symptoms: S:  I can do everything much easier, but I still feel a twinge of pain when I try to put my belt on. Special Tests: UEFI was 71% on 04/01/12 current is  82% Pain Assessment Currently in Pain?: No/denies  Precautions/Restrictions   Progress as tolerated Exercise/Treatments Standing Other Standing Exercises: standing with blue weighed ball completed 5 sequences of :  chest press (front, right, and left), overhead press, chest press (front, right, left), press to floor, chest press (front, right, left) ROM / Strengthening / Isometric Strengthening UBE (Upper Arm Bike): 3' and 3' 4.5 Pushups: Other (comment);5 reps (modified position ) Modified Plank: Other (comment) (15 seconds x 1 rep) Graduated Retraction with Theraband: green x 3 sets Sustained Retraction with Theraband: green x 3 sets Other ROM/Strengthening Exercises: with blue medicine ball did squat sequence involving squatting while lifting ball overhead, then maintaining position while squatting, then squatting while bringing ball back to chest height x 5 reps of entire sequence.        Manual Therapy Manual Therapy: Myofascial release Myofascial Release: In supine, MFR and manual stretching to scapular region, upper arm, and trapezius region to decrease pain and restrictions and increase pain free mobility. [phone]   Occupational Therapy Assessment and Plan OT Assessment and Plan Clinical Impression Statement: A:  Please see monthly progress note.  Omitted all previously completed exercises and added several additional exercises  to focus on shoulder strength needed for pushing/pulling. and overhead llifting.   OT Frequency: Min 2X/week OT Duration: 2 weeks OT Plan: P:  Continue 2 x week x 2 weeks focusing on strengthening only.  DC MFR.  Complete exercises added today and add back previously completed exercises that focus on work hardening, overhead activites.  Add body blade exercises.   Goals Short Term Goals Time to Complete Short Term Goals: 6 weeks Short Term Goal 1: Patient will be educated on HEP. Short Term Goal 1 Progress: Met Short Term Goal 2: Patient will increase left shoulder PROM to [facility] for increased ability to reach overhead at work. 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 Short Term Goal 3 Progress: Met Short Term Goal 4: Patient will decrease pain to 5/10 in his left shoulder when reaching to shoulder height at work. Short Term Goal 4 Progress: Met Short Term Goal 5: Patient will decrease fascial restrictions from moderate to min-mod in his left shoulder. Short Term Goal 5 Progress: Met Long Term Goals Time to Complete Long Term Goals: 12 weeks Long Term Goal 1: Patient will return to prior level of I with all B/IADLs, work, and leisure activities. Long Term Goal 1 Progress: Progressing toward goal Long Term Goal 2: Patient will increase left shoulder AROM to WNL for increased ability to complete job tasks that involve reaching overhead. Long Term Goal 2 Progress: Met Long Term Goal 3: Patient will increase his left shoulder strength to 5/5 for increased ability to push and pull equipment at work. Long Term Goal 3 Progress: Progressing toward goal Long Term Goal 4: Patient will decrease  pain to 2/10 or less in his left shoulder while completing work tasks. Long Term Goal 4 Progress: Met Long Term Goal 5: Patient will decrease fascial restrictions to trace in his left shoulder  region. Long Term Goal 5 Progress: Met Additional Long Term Goals?: Yes Long Term Goal 6: New goal 04/01/12:  Patient will increase external rotation AROM to 70 or more for increased ability to reach behind his head or seat of car.   Long Term Goal 6 Progress: Met  Problem List Patient Active Problem List  Diagnosis  . Rotator cuff tear  . AC separation, type 3, left, sequela  . S/P arthroscopy of shoulder  . Articular cartilage disorder, shoulder region  . Disorders of bursae and tendons in shoulder region, unspecified  . Primary localized osteoarthrosis, shoulder region  . Pain in joint, shoulder region  . Muscle weakness (generalized)    End of Session Activity Tolerance: Patient tolerated treatment well General Behavior During Session: [facility] for tasks performed Cognition: [facility] for tasks performed OT Plan of Care OT Home Exercise Plan: Added modified pushups, sustained and graduated retraction with theraband, and overhead press.  GO    Shirlean Mylar, OTR/L  04/30/2012, 9:25 AM

## 2012-05-03 ENCOUNTER — Ambulatory Visit (INDEPENDENT_AMBULATORY_CARE_PROVIDER_SITE_OTHER): Payer: Worker's Compensation | Admitting: Orthopedic Surgery

## 2012-05-03 ENCOUNTER — Ambulatory Visit (HOSPITAL_COMMUNITY)
Admission: RE | Admit: 2012-05-03 | Discharge: 2012-05-03 | Disposition: A | Discharge status: Home / Self Care | Payer: Private Health Insurance - Indemnity | Source: Ambulatory Visit | Attending: Pulmonary Disease | Admitting: Pulmonary Disease

## 2012-05-03 ENCOUNTER — Encounter: Payer: Self-pay | Admitting: Orthopedic Surgery

## 2012-05-03 VITALS — BP 148/90 | Ht 72.0 in | Wt 220.0 lb

## 2012-05-03 DIAGNOSIS — M719 Bursopathy, unspecified: Secondary | ICD-10-CM

## 2012-05-03 DIAGNOSIS — M25519 Pain in unspecified shoulder: Secondary | ICD-10-CM

## 2012-05-03 DIAGNOSIS — M19019 Primary osteoarthritis, unspecified shoulder: Secondary | ICD-10-CM

## 2012-05-03 DIAGNOSIS — M67919 Unspecified disorder of synovium and tendon, unspecified shoulder: Secondary | ICD-10-CM

## 2012-05-03 DIAGNOSIS — Z9889 Other specified postprocedural states: Secondary | ICD-10-CM

## 2012-05-03 DIAGNOSIS — M6281 Muscle weakness (generalized): Secondary | ICD-10-CM

## 2012-05-03 NOTE — Progress Notes (Signed)
Patient ID: Gregory Marsh, male   DOB: [birth year], 46 y.o.   MRN: [id number] Chief Complaint  Patient presents with  . Follow-up    recheck left shoulder, DOS 01/09/12    Patient's left shoulder is improving he still has some weakness and pain and stiffness but overall is getting better  He still has work restrictions we'll see him in a month with plan to release him to full duty at that time

## 2012-05-03 NOTE — Patient Instructions (Addendum)
Continue therapy at [facility] 

## 2012-05-03 NOTE — Progress Notes (Signed)
Occupational Therapy Treatment Patient Details  Name: Gregory Marsh MRN: [id number] Date of Birth: August 11, 1966  Today's Date: 05/03/2012 Time: 7829-5621 OT Time Calculation (min): 42 min Therapeutic exercise 42' Visit#: 36  of 40   Re-eval: 05/14/12    Authorization: workers compensation - no authorization required    Subjective S:  I had to put some ice on it, but it was ok. Pain Assessment Currently in Pain?: Yes Pain Score:   1 Pain Location: Shoulder Pain Orientation: Left Pain Type: Acute pain  Precautions/Restrictions   progress as tolerated  Exercise/Treatments Seated Protraction: Strengthening;20 reps Protraction Weight (lbs): 3 Horizontal ABduction: Strengthening;20 reps Horizontal ABduction Weight (lbs): 3 External Rotation: Strengthening;20 reps External Rotation Weight (lbs): 3 Internal Rotation: Strengthening;20 reps Internal Rotation Weight (lbs): 3 Flexion: Strengthening;20 reps Flexion Weight (lbs): 3 Abduction: Strengthening;20 reps ABduction Weight (lbs): 3 Prone  Other Prone Exercises: prone on therapy ball I, T, Y 10 reps with 3# Standing Other Standing Exercises: standing with blue weighed ball completed 5 sequences of :  chest press (front, right, and left), overhead press, chest press (front, right, left), press to floor, chest press (front, right, left) ROM / Strengthening / Isometric Strengthening UBE (Upper Arm Bike): 3' and 3' 4.5 Cybex Press: 3 plate;15 reps Cybex Row: 3 plate;15 reps Over Head Lace: resume next visit Pushups:  (modified pushup 5 reps) Modified Plank:  (resume next visit) "W" Arms: 20 reps with 3# X to V Arms: 20 reps with 3# Graduated Retraction with Theraband: green x 3 sets Sustained Retraction with Theraband: green x 3 sets         Occupational Therapy Assessment and Plan OT Assessment and Plan Clinical Impression Statement: A:  Maintained current reps with strengthening exercises as they are a just right  challenge. OT Plan: P:  Add overhead lace and box lift back into exercise rotation.   Goals Short Term Goals Time to Complete Short Term Goals: 6 weeks Short Term Goal 1: Patient will be educated on HEP. Short Term Goal 2: Patient will increase left shoulder PROM to [facility] for increased ability to reach overhead at work. 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 Short Term Goal 4: Patient will decrease pain to 5/10 in his left shoulder when reaching to shoulder height at work. Short Term Goal 5: Patient will decrease fascial restrictions from moderate to min-mod in his left shoulder. Long Term Goals Time to Complete Long Term Goals: 12 weeks Long Term Goal 1: Patient will return to prior level of I with all B/IADLs, work, and leisure activities. Long Term Goal 2: Patient will increase left shoulder AROM to WNL for increased ability to complete job tasks that involve reaching overhead. Long Term Goal 3: Patient will increase his left shoulder strength to 5/5 for increased ability to push and pull equipment at work. Long Term Goal 4: Patient will decrease pain to 2/10 or less in his left shoulder while completing work tasks. Long Term Goal 5: Patient will decrease fascial restrictions to trace in his left shoulder region. Additional Long Term Goals?: Yes Long Term Goal 6: New goal 04/01/12:  Patient will increase external rotation AROM to 70 or more for increased ability to reach behind his head or seat of car.    Problem List Patient Active Problem List  Diagnosis  . Rotator cuff tear  . AC separation, type 3, left, sequela  . S/P arthroscopy of shoulder  .  Articular cartilage disorder, shoulder region  . Disorders of bursae and tendons in shoulder region, unspecified  . Primary localized osteoarthrosis, shoulder region  . Pain in joint, shoulder region  . Muscle weakness (generalized)    End of  Session Activity Tolerance: Patient tolerated treatment well General Behavior During Session: [facility] for tasks performed Cognition: [facility] for tasks performed OT Plan of Care OT Home Exercise Plan: Added shoulder stretches.  GO    Shirlean Mylar, OTR/L  05/03/2012, 9:37 AM

## 2012-05-05 ENCOUNTER — Ambulatory Visit (HOSPITAL_COMMUNITY)
Admission: RE | Admit: 2012-05-05 | Discharge: 2012-05-05 | Disposition: A | Discharge status: Home / Self Care | Payer: Private Health Insurance - Indemnity | Source: Ambulatory Visit | Attending: Orthopedic Surgery | Admitting: Orthopedic Surgery

## 2012-05-05 DIAGNOSIS — M6281 Muscle weakness (generalized): Secondary | ICD-10-CM

## 2012-05-05 DIAGNOSIS — M25519 Pain in unspecified shoulder: Secondary | ICD-10-CM

## 2012-05-05 NOTE — Progress Notes (Signed)
Occupational Therapy Treatment Patient Details  Name: Gregory Marsh MRN: [id number] Date of Birth: Sep 29, 1965  Today's Date: 05/05/2012 Time: 2440-1027 OT Time Calculation (min): 53 min Therapeutic Exercise: 28'  Visit#: 37  of 40   Re-eval: 05/14/12 Assessment Diagnosis: S/P Left SAD and Arthroscopic Acromioplasty  Authorization: workers compensation - no authorization required  Authorization Time Period:    Authorization Visit#:   of    Subjective Symptoms/Limitations Symptoms: S: Im ok today. I went to the doctor on Monday afternoon because it still feels like its pulling in the front. Pain Assessment Pain Score:   1 Pain Location: Shoulder Pain Orientation: Left Pain Type: Acute pain  Precautions/Restrictions  Precautions Precautions: Shoulder  Exercise/Treatments   Seated Protraction: Strengthening;20 reps Protraction Weight (lbs): 3 Horizontal ABduction: Strengthening;20 reps Horizontal ABduction Weight (lbs): 3 External Rotation: Strengthening;20 reps External Rotation Weight (lbs): 3 Internal Rotation: Strengthening;20 reps Internal Rotation Weight (lbs): 3 Flexion: Strengthening;20 reps Flexion Weight (lbs): 3 Abduction: Strengthening;20 reps ABduction Weight (lbs): 3       ROM / Strengthening / Isometric Strengthening UBE (Upper Arm Bike): 3' and 3' 5.0 Over Head Lace: 2' with 3# Pushups: Other (comment) (modified pushup x13) "W" Arms: 20 reps with 3# X to V Arms: 20 reps with 3# Graduated Retraction with Theraband: green x 5 sets Sustained Retraction with Theraband: green x 5 sets Other ROM/Strengthening Exercises:  box lift from floor to waist and waist to shoulder 8# box with 7# inside x 12 reps floor to shoulder, 12 reps shoulder to overhead            Occupational Therapy Assessment and Plan OT Assessment and Plan Clinical Impression Statement: A: Resumed box lifts to establish proper body mechanics for lifting and pt's work load.  Resumed overhead lace with 3# wrist wt. Pt fatiqued at 2.5'; however finished time. Maintained current reps/ wt as pt is  performing with proper form and is challeged enough by the last couple of reps. Some exercises not competed secondary to time. Rehab Potential: Excellent OT Plan: P: Resume other exercises that were not incorporated into 05/05/12 session. Continue to work with box lift to reinforce proper body mechanics and gain strength.    Goals Short Term Goals Time to Complete Short Term Goals: 6 weeks Short Term Goal 1: Patient will be educated on HEP. Short Term Goal 2: Patient will increase left shoulder PROM to [facility] for increased ability to reach overhead at work. 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 Short Term Goal 4: Patient will decrease pain to 5/10 in his left shoulder when reaching to shoulder height at work. Short Term Goal 5: Patient will decrease fascial restrictions from moderate to min-mod in his left shoulder. Long Term Goals Time to Complete Long Term Goals: 12 weeks Long Term Goal 1: Patient will return to prior level of I with all B/IADLs, work, and leisure activities. Long Term Goal 2: Patient will increase left shoulder AROM to WNL for increased ability to complete job tasks that involve reaching overhead. Long Term Goal 3: Patient will increase his left shoulder strength to 5/5 for increased ability to push and pull equipment at work. Long Term Goal 4: Patient will decrease pain to 2/10 or less in his left shoulder while completing work tasks. Long Term Goal 5: Patient will decrease fascial restrictions to trace in his left shoulder region. Additional Long Term Goals?: Yes Long Term Goal  6: New goal 04/01/12:  Patient will increase external rotation AROM to 70 or more for increased ability to reach behind his head or seat of car.    Problem List Patient Active Problem List  Diagnosis    . Rotator cuff tear  . AC separation, type 3, left, sequela  . S/P arthroscopy of shoulder  . Articular cartilage disorder, shoulder region  . Disorders of bursae and tendons in shoulder region, unspecified  . Primary localized osteoarthrosis, shoulder region  . Pain in joint, shoulder region  . Muscle weakness (generalized)    End of Session Activity Tolerance: Patient tolerated treatment well General Behavior During Session: [facility] for tasks performed Cognition: [facility] for tasks performed  GO    Judi Saa, OTAS 05/05/2012, 11:37 AM

## 2012-05-05 NOTE — Progress Notes (Signed)
Note reviewed by clinical instructor and accurately reflects treatment session.  Wells Gerdeman L. Ranette Luckadoo, COTA/L  

## 2012-05-07 ENCOUNTER — Ambulatory Visit (HOSPITAL_COMMUNITY): Payer: Private Health Insurance - Indemnity | Admitting: Specialist

## 2012-05-11 ENCOUNTER — Ambulatory Visit (HOSPITAL_COMMUNITY)
Admission: RE | Admit: 2012-05-11 | Discharge: 2012-05-11 | Disposition: A | Discharge status: Home / Self Care | Payer: Private Health Insurance - Indemnity | Source: Ambulatory Visit | Attending: Orthopedic Surgery | Admitting: Orthopedic Surgery

## 2012-05-11 DIAGNOSIS — M6281 Muscle weakness (generalized): Secondary | ICD-10-CM

## 2012-05-11 DIAGNOSIS — M25619 Stiffness of unspecified shoulder, not elsewhere classified: Secondary | ICD-10-CM | POA: Insufficient documentation

## 2012-05-11 DIAGNOSIS — IMO0001 Reserved for inherently not codable concepts without codable children: Secondary | ICD-10-CM | POA: Insufficient documentation

## 2012-05-11 DIAGNOSIS — M25519 Pain in unspecified shoulder: Secondary | ICD-10-CM | POA: Insufficient documentation

## 2012-05-11 NOTE — Progress Notes (Signed)
Occupational Therapy Treatment Patient Details  Name: Gregory Marsh MRN: [id number] Date of Birth: 1965/09/26  Today's Date: 05/11/2012 Time: 1028-1100 OT Time Calculation (min): 32 min  Visit#: 38  of 40   Re-eval: 05/14/12 Assessment Diagnosis: S/P Left SAD and Arthroscopic Acromioplasty  Authorization: workers compensation    Subjective Symptoms/Limitations Symptoms: S:  I'm doing ok, it just gets tired easy.      Exercise/Treatments Seated Protraction: Strengthening;20 reps Protraction Weight (lbs): 3 Horizontal ABduction: Strengthening;20 reps Horizontal ABduction Weight (lbs): 3 External Rotation: Strengthening;20 reps External Rotation Weight (lbs): 3 Internal Rotation: Strengthening;20 reps Internal Rotation Weight (lbs): 3 Flexion: Strengthening;20 reps Flexion Weight (lbs): 3 Abduction: Strengthening;20 reps ABduction Weight (lbs): 3 Prone  Other Prone Exercises: prone on therapy ball I, T, Y 10 reps with 3# Standing Other Standing Exercises: standing with blue weighed ball completed 5 sequences of :  chest press (front, right, and left), overhead press, chest press (front, right, left), press to floor, chest press (front, right, left) ROM / Strengthening / Isometric Strengthening UBE (Upper Arm Bike): 3' and 3' 5.0 "W" Arms: 20 reps with 3# X to V Arms: 20 reps with 3#   Occupational Therapy Assessment and Plan OT Assessment and Plan Clinical Impression Statement: A:  Patient a little late for session and because of time did not get to compelte all exercises.  Patient fatigued with weighted exercises and needed at least one rest break with each ex. so maintained current reps and weights. OT Plan: P: Resume exercises missed secondary to time.   Goals Short Term Goals Time to Complete Short Term Goals: 6 weeks Short Term Goal 1: Patient will be educated on HEP. Short Term Goal 2: Patient will increase left shoulder PROM to [facility] for increased ability to  reach overhead at work. 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 Short Term Goal 4: Patient will decrease pain to 5/10 in his left shoulder when reaching to shoulder height at work. Short Term Goal 5: Patient will decrease fascial restrictions from moderate to min-mod in his left shoulder. Long Term Goals Time to Complete Long Term Goals: 12 weeks Long Term Goal 1: Patient will return to prior level of I with all B/IADLs, work, and leisure activities. Long Term Goal 2: Patient will increase left shoulder AROM to WNL for increased ability to complete job tasks that involve reaching overhead. Long Term Goal 3: Patient will increase his left shoulder strength to 5/5 for increased ability to push and pull equipment at work. Long Term Goal 4: Patient will decrease pain to 2/10 or less in his left shoulder while completing work tasks. Long Term Goal 5: Patient will decrease fascial restrictions to trace in his left shoulder region. Additional Long Term Goals?: Yes Long Term Goal 6: New goal 04/01/12:  Patient will increase external rotation AROM to 70 or more for increased ability to reach behind his head or seat of car.    Problem List Patient Active Problem List  Diagnosis  . Rotator cuff tear  . AC separation, type 3, left, sequela  . S/P arthroscopy of shoulder  . Articular cartilage disorder, shoulder region  . Disorders of bursae and tendons in shoulder region, unspecified  . Primary localized osteoarthrosis, shoulder region  . Pain in joint, shoulder region  . Muscle weakness (generalized)    End of Session Activity Tolerance: Patient tolerated treatment well General Behavior During Session: [facility] for  tasks performed Cognition: [facility] for tasks performed  GO   Gregory Marsh, COTA/L  05/11/2012, 11:46 AM

## 2012-05-13 ENCOUNTER — Ambulatory Visit (HOSPITAL_COMMUNITY)
Admission: RE | Admit: 2012-05-13 | Discharge: 2012-05-13 | Disposition: A | Discharge status: Home / Self Care | Payer: Private Health Insurance - Indemnity | Source: Ambulatory Visit | Attending: Orthopedic Surgery | Admitting: Orthopedic Surgery

## 2012-05-13 NOTE — Progress Notes (Signed)
Occupational Therapy Treatment Patient Details  Name: Gregory Marsh MRN: [id number] Date of Birth: 12/20/1965  Today's Date: 05/13/2012 Time: 4098-1191 OT Time Calculation (min): 59 min There-ex/work simulation tasks 67'  Visit#: 39  of 44   Re-eval: 05/28/12    Authorization: workers Pharmacist, community Time Period:    Authorization Visit#:   of    Subjective Symptoms/Limitations Symptoms: S:  I am a little worried about going back to work, it still feels weak. Pain Assessment Currently in Pain?: No/denies  Precautions/Restrictions     Exercise/Treatments    Seated Protraction: Strengthening;20 reps Protraction Weight (lbs): 3 Horizontal ABduction: Strengthening;20 reps Horizontal ABduction Weight (lbs): 3 External Rotation: Strengthening;20 reps External Rotation Weight (lbs): 3 Internal Rotation: Strengthening;20 reps Internal Rotation Weight (lbs): 3 Flexion: Strengthening;20 reps Flexion Weight (lbs): 3 Abduction: Strengthening;20 reps ABduction Weight (lbs): 3 Prone  Other Prone Exercises: time   Standing Other Standing Exercises: standing with blue weighed ball completed 5 sequences of :  chest press (front, right, and left), overhead press, chest press (front, right, left), press to floor, chest press (front, right, left) ROM / Strengthening / Isometric Strengthening UBE (Upper Arm Bike): 3' and 3' 6.0 Over Head Lace: time Pushups: Other (comment) (d/c doing at home) "W" Arms: 20 reps with 3# X to V Arms: 20 reps with 3# Graduated Retraction with Theraband: green x 5 sets Sustained Retraction with Theraband: green x 5 sets Other ROM/Strengthening 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  See progress note.  Patient has met all STG's and 3 or 5 LTG's.  Patient's strength a 5/5 for abd, IR/ER and 4+/5 for flexion (strength checked by [name], OTR/L)  Patient stated he still felt weak when trying to do tasks at home and feels he would benefit from 2 more weeks. OT Plan: P:  Continue 2x a week for 2 weeks to increase strength to safely return to work and be able to resume his post surgical work tasks without difficulty.   Goals Short Term Goals Time to Complete Short Term Goals: 6 weeks Short Term Goal 1: Patient will be educated on HEP. Short Term Goal 1 Progress: Met Short Term Goal 2: Patient will increase left shoulder PROM to [facility] for increased ability to reach overhead at work. 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 Short Term Goal 3 Progress: Met Short Term Goal 4: Patient will decrease pain to 5/10 in his left shoulder when reaching to shoulder height at work. Short Term Goal 4 Progress: Met Short Term Goal 5: Patient will decrease fascial restrictions from moderate to min-mod in his left shoulder. Short Term Goal 5 Progress: Met Long Term Goals Time to Complete Long Term Goals: 12 weeks Long Term  Goal 1: Patient will return to prior level of I with all B/IADLs, work, and leisure activities. Long Term Goal 1 Progress: Progressing toward goal Long Term Goal 2: Patient will increase left shoulder AROM to WNL for increased ability to complete job tasks that involve reaching overhead. Long Term Goal 2 Progress: Met Long Term Goal 3: Patient will increase his left  shoulder strength to 5/5 for increased ability to push and pull equipment at work. Long Term Goal 3 Progress: Progressing toward goal Long Term Goal 4: Patient will decrease pain to 2/10 or less in his left shoulder while completing work tasks. Long Term Goal 4 Progress: Met Long Term Goal 5: Patient will decrease fascial restrictions to trace in his left shoulder region. Long Term Goal 5 Progress: Met Additional Long Term Goals?: Yes Long Term Goal 6: New goal 04/01/12:  Patient will increase external rotation AROM to 70 or more for increased ability to reach behind his head or seat of car.    Problem List Patient Active Problem List  Diagnosis  . Rotator cuff tear  . AC separation, type 3, left, sequela  . S/P arthroscopy of shoulder  . Articular cartilage disorder, shoulder region  . Disorders of bursae and tendons in shoulder region, unspecified  . Primary localized osteoarthrosis, shoulder region  . Pain in joint, shoulder region  . Muscle weakness (generalized)    End of Session Activity Tolerance: Patient tolerated treatment well General Behavior During Session: [facility] for tasks performed Cognition: [facility] for tasks performed  GO Elih Mooney L. Noralee Stain, COTA/L    05/13/2012, 3:21 PM

## 2012-05-20 ENCOUNTER — Ambulatory Visit (HOSPITAL_COMMUNITY)
Admission: RE | Admit: 2012-05-20 | Discharge: 2012-05-20 | Disposition: A | Discharge status: Home / Self Care | Payer: Private Health Insurance - Indemnity | Source: Ambulatory Visit | Attending: Occupational Therapy | Admitting: Occupational Therapy

## 2012-05-20 DIAGNOSIS — M6281 Muscle weakness (generalized): Secondary | ICD-10-CM

## 2012-05-20 DIAGNOSIS — M25519 Pain in unspecified shoulder: Secondary | ICD-10-CM

## 2012-05-20 NOTE — Progress Notes (Signed)
Occupational Therapy Treatment Patient Details  Name: Gregory Marsh MRN: [id number] Date of Birth: 21-Feb-1966  Today's Date: 05/20/2012 Time: 4098-1191 OT Time Calculation (min): 45 min Therex 45'  Visit#: 40  of 44   Re-eval: 05/28/12    Authorization: workers compensation  Authorization Time Period:    Authorization Visit#:   of    Subjective Symptoms/Limitations Symptoms: S:  It's not really hurting but I can feel it. Pain Assessment Currently in Pain?: Yes Pain Score:   1 Pain Location: Shoulder Pain Orientation: Left Pain Type: Acute pain  Precautions/Restrictions     Exercise/Treatments Seated Protraction: Strengthening;20 reps Protraction Weight (lbs): 3 Horizontal ABduction: Strengthening;20 reps Horizontal ABduction Weight (lbs): 3 External Rotation: Strengthening;20 reps External Rotation Weight (lbs): 3 Internal Rotation: Strengthening;20 reps Internal Rotation Weight (lbs): 3 Flexion: Strengthening;20 reps Flexion Weight (lbs): 3 Abduction: Strengthening;20 reps ABduction Weight (lbs): 3 Other Seated Exercises: dc Prone  Other Prone Exercises: time    Standing Other Standing Exercises: standing with blue weighed ball completed 5 sequences of :  chest press (front, right, and left), overhead press, chest press (front, right, left), press to floor, chest press (front, right, left) ROM / Strengthening / Isometric Strengthening UBE (Upper Arm Bike): 3' and 3' 6.0 Over Head Lace: time "W" Arms: 20 reps with 3# X to V Arms: 20 reps with 3# Graduated Retraction with Theraband: green x 5 sets Sustained Retraction with Theraband: green x 5 sets Other ROM/Strengthening Exercises: see work Catering manager Other ROM/Strengthening Exercises: with green weighted ball rotating circling around body, handing to therapist behind and reaching with surgical arm behing to grab ball at different levels of his back x 10   Occupational Therapy Assessment and  Plan OT Assessment and Plan Clinical Impression Statement: A:  Added standing ball circles with green weighted ball to facilitate increase AROM and strength with IR.  Kept weighted ex. at 3# secondary to fatigue. OT Plan: P:  Increase weighted ex to 4# and decrease reps.   Goals Short Term Goals Time to Complete Short Term Goals: 6 weeks Short Term Goal 1: Patient will be educated on HEP. Short Term Goal 2: Patient will increase left shoulder PROM to [facility] for increased ability to reach overhead at work. 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 Short Term Goal 4: Patient will decrease pain to 5/10 in his left shoulder when reaching to shoulder height at work. Short Term Goal 5: Patient will decrease fascial restrictions from moderate to min-mod in his left shoulder. Long Term Goals Time to Complete Long Term Goals: 12 weeks Long Term Goal 1: Patient will return to prior level of I with all B/IADLs, work, and leisure activities. Long Term Goal 2: Patient will increase left shoulder AROM to WNL for increased ability to complete job tasks that involve reaching overhead. Long Term Goal 3: Patient will increase his left shoulder strength to 5/5 for increased ability to push and pull equipment at work. Long Term Goal 4: Patient will decrease pain to 2/10 or less in his left shoulder while completing work tasks. Long Term Goal 5: Patient will decrease fascial restrictions to trace in his left shoulder region. Additional Long Term Goals?: Yes Long Term Goal 6: New goal 04/01/12:  Patient will increase external rotation AROM to 70 or more for increased ability to reach behind his head or seat of car.    Problem List Patient Active Problem List  Diagnosis  . Rotator cuff tear  . AC separation, type 3, left, sequela  . S/P arthroscopy of shoulder  . Articular cartilage disorder, shoulder region  . Disorders of bursae and  tendons in shoulder region, unspecified  . Primary localized osteoarthrosis, shoulder region  . Pain in joint, shoulder region  . Muscle weakness (generalized)    End of Session Activity Tolerance: Patient tolerated treatment well General Behavior During Session: [facility] for tasks performed Cognition: [facility] for tasks performed  GO   Ruthmary Occhipinti L. Amelita Risinger, COTA/L  05/20/2012, 9:52 AM

## 2012-05-24 ENCOUNTER — Ambulatory Visit (HOSPITAL_COMMUNITY): Payer: Self-pay | Admitting: Occupational Therapy

## 2012-05-26 ENCOUNTER — Ambulatory Visit (HOSPITAL_COMMUNITY)
Admission: RE | Admit: 2012-05-26 | Discharge: 2012-05-26 | Disposition: A | Discharge status: Home / Self Care | Payer: Private Health Insurance - Indemnity | Source: Ambulatory Visit | Attending: Orthopedic Surgery | Admitting: Orthopedic Surgery

## 2012-05-26 DIAGNOSIS — M6281 Muscle weakness (generalized): Secondary | ICD-10-CM

## 2012-05-26 DIAGNOSIS — M25519 Pain in unspecified shoulder: Secondary | ICD-10-CM

## 2012-05-26 NOTE — Progress Notes (Signed)
Occupational Therapy Treatment Patient Details  Name: Gregory Marsh MRN: [id number] Date of Birth: 04-Feb-1966  Today's Date: 05/26/2012 Time: 1308-6578 OT Time Calculation (min): 46 min Therapeutic Exercises 46' Visit#: 41  of 44   Re-eval: 05/26/12    Authorization: workers compensation    Subjective S: I think the sustained activity tolerance is my biggest concern. - discussed expected outcomes and duration with client. "I think I can do this at home." Limitations: upon palpation, patient tender in anterior shoulder region and has an elevated distal clavicle.  recommended discussing with MD. Pain Assessment Currently in Pain?: No/denies Pain Score: 0-No pain  UEFI = 90% Exercise/Treatments Seated Protraction: Strengthening;10 reps Protraction Weight (lbs): 4 Horizontal ABduction: Strengthening;10 reps Horizontal ABduction Weight (lbs): 4 External Rotation: Strengthening;10 reps External Rotation Weight (lbs): 4 Internal Rotation: Strengthening;10 reps Internal Rotation Weight (lbs): 4 Flexion: Strengthening;10 reps Flexion Weight (lbs): 4 Abduction: Strengthening;10 reps ABduction Weight (lbs): 4 Prone  Other Prone Exercises: prone on therapy ball I T Y 4# x 10 reps Standing Other Standing Exercises: standing with blue weighed ball completed 5 sequences of :  chest press (front, right, and left), overhead press, chest press (front, right, left), press to floor, chest press (front, right, left) ROM / Strengthening / Isometric Strengthening UBE (Upper Arm Bike): 3' and 3' 6.0 "W" Arms: 4# x 10 reps X to V Arms: 4# x 10 reps    Body Blade Flexion: 45 seconds ABduction: 45 seconds External Rotation: 45 seconds Internal Rotation: 45 seconds Other Body Blade Exercises: overhead lift from chest to overhead x 10 seconds each position x 10 reps        Occupational Therapy Assessment and Plan OT Assessment and Plan Clinical Impression Statement: A:  Please see dc  summary OT Plan: DC with HEP for continued work on sustained activity tolerance.     Goals Short Term Goals Time to Complete Short Term Goals: 6 weeks Short Term Goal 1: Patient will be educated on HEP. Short Term Goal 1 Progress: Met Short Term Goal 2: Patient will increase left shoulder PROM to [facility] for increased ability to reach overhead at work. 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 Short Term Goal 3 Progress: Met Short Term Goal 4: Patient will decrease pain to 5/10 in his left shoulder when reaching to shoulder height at work. Short Term Goal 4 Progress: Met Short Term Goal 5: Patient will decrease fascial restrictions from moderate to min-mod in his left shoulder. Short Term Goal 5 Progress: Met Long Term Goals Time to Complete Long Term Goals: 12 weeks Long Term Goal 1: Patient will return to prior level of I with all B/IADLs, work, and leisure activities. Long Term Goal 1 Progress: Progressing toward goal Long Term Goal 2: Patient will increase left shoulder AROM to WNL for increased ability to complete job tasks that involve reaching overhead. Long Term Goal 2 Progress: Met Long Term Goal 3: Patient will increase his left shoulder strength to 5/5 for increased ability to push and pull equipment at work. Long Term Goal 3 Progress: Progressing toward goal Long Term Goal 4: Patient will decrease pain to 2/10 or less in his left shoulder while completing work tasks. Long Term Goal 4 Progress: Met Long Term Goal 5: Patient will decrease fascial restrictions to trace in his left shoulder region. Long Term Goal 5 Progress: Met Additional Long Term Goals?:  Yes Long Term Goal 6: New goal 04/01/12:  Patient will increase external rotation AROM to 70 or more for increased ability to reach behind his head or seat of car.   Long Term Goal 6 Progress: Met  Problem List Patient  Active Problem List  Diagnosis  . Rotator cuff tear  . AC separation, type 3, left, sequela  . S/P arthroscopy of shoulder  . Articular cartilage disorder, shoulder region  . Disorders of bursae and tendons in shoulder region, unspecified  . Primary localized osteoarthrosis, shoulder region  . Pain in joint, shoulder region  . Muscle weakness (generalized)    End of Session Activity Tolerance: Patient tolerated treatment well General Behavior During Session: [facility] for tasks performed Cognition: [facility] for tasks performed OT Plan of Care OT Home Exercise Plan: Sustained activity tolerance, proximal shoulder stability  GO    Shirlean Mylar, OTR/L  05/26/2012, 10:45 AM

## 2012-05-27 ENCOUNTER — Ambulatory Visit (HOSPITAL_COMMUNITY): Payer: Self-pay | Admitting: Occupational Therapy

## 2012-05-31 ENCOUNTER — Ambulatory Visit (HOSPITAL_COMMUNITY): Payer: Self-pay | Admitting: Specialist

## 2012-06-03 ENCOUNTER — Ambulatory Visit (HOSPITAL_COMMUNITY): Payer: Self-pay | Admitting: Specialist

## 2012-06-07 ENCOUNTER — Ambulatory Visit (HOSPITAL_COMMUNITY): Payer: Self-pay | Admitting: Specialist

## 2012-06-07 ENCOUNTER — Encounter: Payer: Self-pay | Admitting: Orthopedic Surgery

## 2012-06-07 ENCOUNTER — Ambulatory Visit (INDEPENDENT_AMBULATORY_CARE_PROVIDER_SITE_OTHER): Payer: Private Health Insurance - Indemnity | Admitting: Orthopedic Surgery

## 2012-06-07 VITALS — BP 140/90 | Ht 72.0 in | Wt 220.0 lb

## 2012-06-07 DIAGNOSIS — M25512 Pain in left shoulder: Secondary | ICD-10-CM

## 2012-06-07 DIAGNOSIS — M25519 Pain in unspecified shoulder: Secondary | ICD-10-CM

## 2012-06-07 DIAGNOSIS — M19019 Primary osteoarthritis, unspecified shoulder: Secondary | ICD-10-CM

## 2012-06-07 MED ORDER — HYDROCODONE-ACETAMINOPHEN 5-325 MG PO TABS: 1.0000 | ORAL_TABLET | Freq: Four times a day (QID) | ORAL | Status: DC | PRN

## 2012-06-07 NOTE — Progress Notes (Signed)
Patient ID: Gregory Marsh, male   DOB: August 29, 1965, 46 y.o.   MRN: [id number] Chief Complaint  Patient presents with  . Follow-up    follow up left shoulder, DOS 01/09/12    1. Shoulder pain, left  HYDROcodone-acetaminophen (NORCO) 5-325 MG per tablet  2. Primary localized osteoarthrosis, shoulder region    3. Pain in joint, shoulder region     [name] comes in today making significant improvements overall in his range of motion and strength however is having pain over the a.c. joint.  In review:  DOS 01/09/12    PRE-OPERATIVE DIAGNOSIS: torn rotator cuff, symptomatic ac joint arthrosis left shoulder   POST-OPERATIVE DIAGNOSIS:   1. OA LEFT SHOULDER AC JOINT   2. ANTERIOR LABRAL TEAR   3. PARTIAL SUBSCAPULARIS TEAR   4. OS ACROMIALE   5. ROTATOR CUFF TENDONOSIS   PROCEDURE: Procedure(s) (LRB):   1. LEFT SHOULDER ARTHROSCOPY DEBRIDEMENT LIMITED 42595   2. ARTHROSCOPIC ACROMIOPLASTY 29826   3. OPEN DISTAL CLAVICLE 23120   FINDINGS:   Degenerative tear anterior labrum   Partial tear subscapularis intra-articular   Os acromiale, fibrous union   Osteoarthritis acromioclavicular joint  BP 140/90  Ht 6' (1.829 m)  Wt 220 lb (99.791 kg)  BMI 29.84 kg/m2  At this point he is still having tenderness over his left a.c. joint on clinical exam. He has 110 of abduction 150 of forward elevation internal rotation to T7. The supraspinatus strength is grade 5 external rotation strength is grade 5 he did only have 35 of external rotation with his arm at his side.  Neurovascular exam intact  I gave him an injection over his distal clavicle  and in the residual portion of the joint  Recommend 1 more week out of work take Norco 7.5 for pain come back in a week to see if this helped  Diagnosis pain shoulder Status post a.c. joint grade 3 injury A.c. joint arthrosis Partial subscapularis tear Partial labral tear

## 2012-06-07 NOTE — Patient Instructions (Addendum)
You have received a steroid shot. 15% of patients experience increased pain at the injection site with in the next 24 hours. This is best treated with ice and tylenol extra strength 2 tabs every 8 hours. If you are still having pain please call the office.    

## 2012-06-09 ENCOUNTER — Ambulatory Visit (HOSPITAL_COMMUNITY): Payer: Self-pay | Admitting: Specialist

## 2012-06-15 ENCOUNTER — Ambulatory Visit (INDEPENDENT_AMBULATORY_CARE_PROVIDER_SITE_OTHER): Payer: Private Health Insurance - Indemnity | Admitting: Orthopedic Surgery

## 2012-06-15 ENCOUNTER — Encounter: Payer: Self-pay | Admitting: Orthopedic Surgery

## 2012-06-15 VITALS — Ht 72.0 in | Wt 220.0 lb

## 2012-06-15 DIAGNOSIS — Z9889 Other specified postprocedural states: Secondary | ICD-10-CM

## 2012-06-15 DIAGNOSIS — M25519 Pain in unspecified shoulder: Secondary | ICD-10-CM

## 2012-06-15 NOTE — Progress Notes (Signed)
Patient ID: Gregory Marsh, male   DOB: 07-Apr-1966, 46 y.o.   MRN: [id number] Chief Complaint  Patient presents with  . Follow-up    1 week recheck on left shoulder after injection.    Ht 6' (1.829 m)  Wt 220 lb (99.791 kg)  BMI 29.84 kg/m2  1. Pain in joint, shoulder region   2. S/P arthroscopy of shoulder    Postinjection left shoulder after surgical treatment of the left shoulder a.c. separation and painful a.c. joint approximate 6 months ago.  Residual pain noted injected last week with improvement  Patient can return to work on the 18th  Call for any refills on medication. Last visit.  He complained of some sciatic-like symptoms in his left hip and leg down to his foot I referred him to Dr. Ladona Ridgel chiropractor

## 2012-06-15 NOTE — Addendum Note (Signed)
Addended by: Vickki Hearing on: 06/15/2012 08:39 AM   Modules accepted: Level of Service

## 2012-06-15 NOTE — Patient Instructions (Addendum)
Return to work in 2 weeks on the 18th of November   Call the pharmacy if you need any prescription refills

## 2012-07-06 ENCOUNTER — Other Ambulatory Visit (HOSPITAL_COMMUNITY): Payer: Self-pay | Admitting: Chiropractic Medicine

## 2012-07-06 DIAGNOSIS — M545 Low back pain, unspecified: Secondary | ICD-10-CM

## 2012-07-07 ENCOUNTER — Ambulatory Visit (HOSPITAL_COMMUNITY)
Admission: RE | Admit: 2012-07-07 | Discharge: 2012-07-07 | Disposition: A | Discharge status: Home / Self Care | Payer: Private Health Insurance - Indemnity | Source: Ambulatory Visit | Attending: Chiropractic Medicine | Admitting: Chiropractic Medicine

## 2012-07-07 DIAGNOSIS — M545 Low back pain, unspecified: Secondary | ICD-10-CM

## 2012-07-07 DIAGNOSIS — M5126 Other intervertebral disc displacement, lumbar region: Secondary | ICD-10-CM | POA: Insufficient documentation

## 2019-07-04 ENCOUNTER — Encounter (INDEPENDENT_AMBULATORY_CARE_PROVIDER_SITE_OTHER): Payer: Self-pay

## 2019-07-04 ENCOUNTER — Other Ambulatory Visit: Payer: Self-pay

## 2019-07-04 ENCOUNTER — Encounter: Payer: Self-pay | Admitting: Family Medicine

## 2019-07-04 ENCOUNTER — Ambulatory Visit (INDEPENDENT_AMBULATORY_CARE_PROVIDER_SITE_OTHER): Payer: BC Managed Care – PPO | Admitting: Family Medicine

## 2019-07-04 VITALS — BP 181/107 | HR 85 | Temp 98.3°F | Resp 15 | Ht 72.0 in | Wt 206.0 lb

## 2019-07-04 DIAGNOSIS — M549 Dorsalgia, unspecified: Secondary | ICD-10-CM

## 2019-07-04 DIAGNOSIS — H539 Unspecified visual disturbance: Secondary | ICD-10-CM | POA: Insufficient documentation

## 2019-07-04 DIAGNOSIS — R030 Elevated blood-pressure reading, without diagnosis of hypertension: Secondary | ICD-10-CM

## 2019-07-04 DIAGNOSIS — Z1211 Encounter for screening for malignant neoplasm of colon: Secondary | ICD-10-CM | POA: Diagnosis not present

## 2019-07-04 DIAGNOSIS — Z125 Encounter for screening for malignant neoplasm of prostate: Secondary | ICD-10-CM

## 2019-07-04 DIAGNOSIS — J309 Allergic rhinitis, unspecified: Secondary | ICD-10-CM | POA: Insufficient documentation

## 2019-07-04 DIAGNOSIS — Z79899 Other long term (current) drug therapy: Secondary | ICD-10-CM

## 2019-07-04 DIAGNOSIS — J302 Other seasonal allergic rhinitis: Secondary | ICD-10-CM
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
## 2019-07-04 NOTE — Patient Instructions (Addendum)
Watch your caffeine and nicotine Purchase or borrow blood pressure cuff- Take blood pressure at home-first thing in the morning-write it down Fasting labwork at [facility](10-12 hour fast) Spine specialist-need xrays before seeing specialist xrays at [facility]

## 2019-07-05 ENCOUNTER — Encounter: Payer: Self-pay | Admitting: Gastroenterology

## 2019-07-05 ENCOUNTER — Ambulatory Visit (HOSPITAL_COMMUNITY)
Admission: RE | Admit: 2019-07-05 | Discharge: 2019-07-05 | Disposition: A | Discharge status: Home / Self Care | Payer: BC Managed Care – PPO | Source: Ambulatory Visit | Attending: Family Medicine | Admitting: Family Medicine

## 2019-07-05 DIAGNOSIS — M47812 Spondylosis without myelopathy or radiculopathy, cervical region: Secondary | ICD-10-CM | POA: Diagnosis not present

## 2019-07-05 DIAGNOSIS — M549 Dorsalgia, unspecified: Secondary | ICD-10-CM | POA: Diagnosis not present

## 2019-07-05 DIAGNOSIS — M545 Low back pain: Secondary | ICD-10-CM | POA: Diagnosis not present

## 2019-07-05 DIAGNOSIS — M542 Cervicalgia: Secondary | ICD-10-CM | POA: Diagnosis not present

## 2019-07-05 DIAGNOSIS — M47814 Spondylosis without myelopathy or radiculopathy, thoracic region: Secondary | ICD-10-CM | POA: Diagnosis not present

## 2019-07-05 LAB — COMPLETE METABOLIC PANEL WITH GFR
AG Ratio: 1.4 (calc) (ref 1.0–2.5)
ALT: 22 U/L (ref 9–46)
AST: 22 U/L (ref 10–35)
Albumin: 4.4 g/dL (ref 3.6–5.1)
Alkaline phosphatase (APISO): 54 U/L (ref 35–144)
BUN: 11 mg/dL (ref 7–25)
CO2: 29 mmol/L (ref 20–32)
Calcium: 9.3 mg/dL (ref 8.6–10.3)
Chloride: 103 mmol/L (ref 98–110)
Creat: 0.96 mg/dL (ref 0.70–1.33)
GFR, Est African American: 103 mL/min/{1.73_m2} (ref >=60)
GFR, Est Non African American: 89 mL/min/{1.73_m2} (ref >=60)
Globulin: 3.1 g/dL (calc) (ref 1.9–3.7)
Glucose, Bld: 108 mg/dL (ref 65–139)
Potassium: 4.3 mmol/L (ref 3.5–5.3)
Sodium: 140 mmol/L (ref 135–146)
Total Bilirubin: 0.5 mg/dL (ref 0.2–1.2)
Total Protein: 7.5 g/dL (ref 6.1–8.1)

## 2019-07-05 LAB — LIPID PANEL
Cholesterol: 197 mg/dL (ref <200)
HDL: 77 mg/dL (ref >=40)
LDL Cholesterol (Calc): 104 mg/dL (calc) — ABNORMAL HIGH
Non-HDL Cholesterol (Calc): 120 mg/dL (calc) (ref <130)
Total CHOL/HDL Ratio: 2.6 (calc) (ref <5.0)
Triglycerides: 72 mg/dL (ref <150)

## 2019-07-05 LAB — CBC WITH DIFFERENTIAL/PLATELET
Absolute Monocytes: 561 cells/uL (ref 200–950)
Basophils Absolute: 41 cells/uL (ref 0–200)
Basophils Relative: 0.9 %
Eosinophils Absolute: 92 cells/uL (ref 15–500)
Eosinophils Relative: 2 %
HCT: 40.6 % (ref 38.5–50.0)
Hemoglobin: 13.8 g/dL (ref 13.2–17.1)
Lymphs Abs: 2157 cells/uL (ref 850–3900)
MCH: 32.2 pg (ref 27.0–33.0)
MCHC: 34 g/dL (ref 32.0–36.0)
MCV: 94.9 fL (ref 80.0–100.0)
MPV: 9.1 fL (ref 7.5–12.5)
Monocytes Relative: 12.2 %
Neutro Abs: 1748 cells/uL (ref 1500–7800)
Neutrophils Relative %: 38 %
Platelets: 241 10*3/uL (ref 140–400)
RBC: 4.28 10*6/uL (ref 4.20–5.80)
RDW: 12.4 % (ref 11.0–15.0)
Total Lymphocyte: 46.9 %
WBC: 4.6 10*3/uL (ref 3.8–10.8)

## 2019-07-05 LAB — PSA: PSA: 0.4 ng/mL (ref <=4.0)

## 2019-07-18 ENCOUNTER — Other Ambulatory Visit: Payer: Self-pay

## 2019-07-18 ENCOUNTER — Ambulatory Visit (INDEPENDENT_AMBULATORY_CARE_PROVIDER_SITE_OTHER): Payer: BC Managed Care – PPO | Admitting: Family Medicine

## 2019-07-18 VITALS — BP 162/99 | HR 104 | Temp 98.0°F | Ht 72.0 in | Wt 205.0 lb

## 2019-07-18 DIAGNOSIS — R030 Elevated blood-pressure reading, without diagnosis of hypertension: Secondary | ICD-10-CM | POA: Diagnosis not present

## 2019-07-18 DIAGNOSIS — M549 Dorsalgia, unspecified: Secondary | ICD-10-CM | POA: Diagnosis not present

## 2019-07-18 LAB — POCT URINALYSIS DIP (CLINITEK)
Bilirubin, UA: NEGATIVE
Blood, UA: NEGATIVE
Glucose, UA: NEGATIVE mg/dL
Ketones, POC UA: NEGATIVE mg/dL
Leukocytes, UA: NEGATIVE
Nitrite, UA: NEGATIVE
POC PROTEIN,UA: NEGATIVE
Spec Grav, UA: >=1.03 — AB (ref 1.010–1.025)
Urobilinogen, UA: 0.2 E.U./dL
pH, UA: 5.5 (ref 5.0–8.0)

## 2019-07-18 LAB — MICROALBUMIN, URINE
A1c: <30
Albumin: 10
Creat: 300

## 2019-07-18 NOTE — Progress Notes (Signed)
Established Patient Office Visit  Subjective:  Patient ID: Gregory Marsh, male    DOB: [birth year]  Age: 53 y.o. MRN: [id number]  CC:  Chief Complaint  Patient presents with  . Back Pain    FOLLOW UP    HPI Gregory RidingJohn A Norment presents for blood pressure follow up-176/99-115/75. Pt states he has never taken blood pressure medication-taking blood pressure readings at home. +FH mom-no CAD, aunts with HTN-reviewed labwork.   Back pain with h/o of DDD-repeat xrays showed cervical, thorac and lumbar DDD-pt with no previous surgeries-MRI in 2013 showed moderate to severe DDD .  Pt with back pain in the neck and back. Pt started radiation into the left hip and foot. Worse with laying down. No worse with walking.  Pt states loosing balancing with walking. Sometimes switches to the right side.  No change in bowels. No loss of urine  IMPRESSION: 06/2012  1.  L5-S1 disc herniation and sequestered fragment in the left lateral recess and proximal left L5 neural foramen as detailed above. Legrand RamsFavor this as the symptomatic abnormality and correlate for left L5 and left S1 radiculitis. 2.  Superimposed acquired and congenital diffuse spinal stenosis, most pronounced at L4-L5 (severe), L2-L3 and L3-L4 (moderate). 3.  Left L4 neural foraminal annular tear of the L4-L5 disc which could be an additional source for left-sided radiculitis.  Past Medical History:  Diagnosis Date  . AC separation, type 3, left, sequela 12/10/2011    Past Surgical History:  Procedure Laterality Date  . SHOULDER ARTHROSCOPY  01/09/2012   Procedure: ARTHROSCOPY SHOULDER;  Surgeon: Vickki HearingStanley E Harrison, MD;  Location: [facility];  Service: Orthopedics;  Laterality: Left;  with debridement  . SHOULDER OPEN ROTATOR CUFF REPAIR  01/09/2012   Procedure: ROTATOR CUFF REPAIR SHOULDER OPEN;  Surgeon: Vickki HearingStanley E Harrison, MD;  Location: [facility];  Service: Orthopedics;  Laterality: Left;  with distal clavicle excision    Family History  Problem  Relation Age of Onset  . Diabetes Other   . Diabetes Mother   . Anesthesia problems Neg Hx   . Hypotension Neg Hx   . Malignant hyperthermia Neg Hx   . Pseudochol deficiency Neg Hx     Social History   Socioeconomic History  . Marital status: Married    Spouse name: Not on file  . Number of children: Not on file  . Years of education: 7212  . Highest education level: Not on file  Occupational History    [employer]er[employer]  Social Needs  . Financial resource strain: Not on file  . Food insecurity    Worry: Not on file    Inability: Not on file  . Transportation needs    Medical: Not on file    Non-medical: Not on file  Tobacco Use  . Smoking status: Light Tobacco Smoker    Years: 10.00    Types: Cigars  . Smokeless tobacco: Never Used  Substance and Sexual Activity  . Alcohol use: Yes    Alcohol/week: 6.0 standard drinks    Types: 6 Cans of beer per week    Comment: occasionally  . Drug use: Yes    Types: Marijuana  . Sexual activity: Yes    Birth control/protection: None  Lifestyle  . Physical activity    Days per week: Not on file    Minutes per session: Not on file  . Stress: Not on file  Relationships  . Social connections    Talks on phone: Not on file  Gets together: Not on file    Attends religious service: Not on file    Active member of club or organization: Not on file    Attends meetings of clubs or organizations: Not on file    Relationship status: Not on file  . Intimate partner violence    Fear of current or ex partner: Not on file    Emotionally abused: Not on file    Physically abused: Not on file    Forced sexual activity: Not on file  Other Topics Concern  . Not on file  Social History Narrative  . Not on file    Outpatient Medications Prior to Visit  Medication Sig Dispense Refill  . cetirizine (ZYRTEC) 10 MG tablet Take 10 mg by mouth every morning.    Marland Kitchen HYDROcodone-acetaminophen (NORCO) 5-325 MG per tablet Take 1 tablet by  mouth every 6 (six) hours as needed for pain. (Patient not taking: Reported on 07/18/2019) 30 tablet 0  . promethazine (PHENERGAN) 12.5 MG tablet Take 12.5 mg by mouth every 6 (six) hours as needed.     No facility-administered medications prior to visit.     No Known Allergies  ROS Review of Systems  Constitutional: Negative.   HENT: Negative.   Eyes: Negative.   Respiratory: Negative.   Cardiovascular: Negative.   Gastrointestinal: Negative.   Endocrine: Negative.   Genitourinary: Negative.   Musculoskeletal: Positive for back pain.       Radiation into the left foot-at rest,  Not worse with walking   Skin: Negative.   Allergic/Immunologic: Negative.   Neurological: Positive for numbness.       Radicular pain in the left foot  Hematological: Negative.   Psychiatric/Behavioral: Negative.       Objective:    Physical Exam  Constitutional: He is oriented to person, place, and time. He appears well-developed and well-nourished.  HENT:  Head: Normocephalic and atraumatic.  Eyes: Conjunctivae are normal.  Neck: Normal range of motion. Neck supple.  Cardiovascular: Normal rate, regular rhythm, normal heart sounds and intact distal pulses.  Pulmonary/Chest: Effort normal and breath sounds normal.  Neurological: He is oriented to person, place, and time.  Psychiatric: He has a normal mood and affect.    BP (!) 162/99 (BP Location: Right Arm, Patient Position: Sitting, Cuff Size: Normal)   Pulse (!) 104   Temp 98 F (36.7 C) (Oral)   Ht 6' (1.829 m)   Wt 205 lb (93 kg)   SpO2 98%   BMI 27.80 kg/m  Wt Readings from Last 3 Encounters:  07/18/19 205 lb (93 kg)  07/04/19 206 lb (93.4 kg)  06/15/12 220 lb (99.8 kg)     Health Maintenance Due  Topic Date Due  . HIV Screening  03/24/1980  . TETANUS/TDAP  03/24/1984  . COLONOSCOPY  03/25/2015  . INFLUENZA VACCINE  03/12/2019   Lab Results  Component Value Date   WBC 4.6 07/04/2019   HGB 13.8 07/04/2019   HCT 40.6  07/04/2019   MCV 94.9 07/04/2019   PLT 241 07/04/2019   Lab Results  Component Value Date   NA 140 07/04/2019   K 4.3 07/04/2019   CO2 29 07/04/2019   GLUCOSE 108 07/04/2019   BUN 11 07/04/2019   CREATININE 0.96 07/04/2019   BILITOT 0.5 07/04/2019   ALKPHOS 60 01/22/2010   AST 22 07/04/2019   ALT 22 07/04/2019   PROT 7.5 07/04/2019   ALBUMIN 4.1 01/22/2010   CALCIUM 9.3 07/04/2019   Lab  Results  Component Value Date   CHOL 197 07/04/2019   Lab Results  Component Value Date   HDL 77 07/04/2019   Lab Results  Component Value Date   LDLCALC 104 (H) 07/04/2019   Lab Results  Component Value Date   TRIG 72 07/04/2019   Lab Results  Component Value Date   CHOLHDL 2.6 07/04/2019   No results found for: HGBA1C    Assessment & Plan:   1. Elevated blood pressure reading Continue to take blood pressure readings-noted elevated on this visit and last visit. Borderline in 2013 with last medical care-renal function normal. Pt understands if continued increase greater than 140/90 will start blood pressure medication in 2 weeks. Will start amlodipine 5mg  if continued elevation  2. Spine pain, multilevel-pt previously did not seek care for  DDD- pt is now requesting referral for additional evaluation and treatment Follow-up: 2 weeks-telephone visit-BP elevation   LISA Hannah Beat, MD

## 2019-07-18 NOTE — Patient Instructions (Addendum)
Check blood pressure at home in the morning-write down and schedule phone visits to discuss starting blood pressure medication-if continued elevated greater than 140/90.

## 2019-07-21 ENCOUNTER — Ambulatory Visit (INDEPENDENT_AMBULATORY_CARE_PROVIDER_SITE_OTHER): Payer: BC Managed Care – PPO | Admitting: Orthopaedic Surgery

## 2019-07-21 ENCOUNTER — Other Ambulatory Visit: Payer: Self-pay

## 2019-07-21 ENCOUNTER — Ambulatory Visit: Payer: BC Managed Care – PPO | Admitting: Orthopaedic Surgery

## 2019-07-21 ENCOUNTER — Encounter: Payer: Self-pay | Admitting: Orthopaedic Surgery

## 2019-07-21 DIAGNOSIS — M48062 Spinal stenosis, lumbar region with neurogenic claudication: Secondary | ICD-10-CM

## 2019-07-21 DIAGNOSIS — M4807 Spinal stenosis, lumbosacral region: Secondary | ICD-10-CM | POA: Diagnosis not present

## 2019-07-21 MED ORDER — PREDNISONE 10 MG (21) PO TBPK: ORAL_TABLET | ORAL | 0 refills | Status: DC
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
## 2019-07-22 DIAGNOSIS — M48061 Spinal stenosis, lumbar region without neurogenic claudication: Secondary | ICD-10-CM | POA: Insufficient documentation

## 2019-08-01 ENCOUNTER — Other Ambulatory Visit: Payer: Self-pay

## 2019-08-01 ENCOUNTER — Telehealth: Payer: Self-pay | Admitting: Orthopaedic Surgery

## 2019-08-01 ENCOUNTER — Telehealth (INDEPENDENT_AMBULATORY_CARE_PROVIDER_SITE_OTHER): Payer: BC Managed Care – PPO | Admitting: Family Medicine

## 2019-08-01 DIAGNOSIS — I10 Essential (primary) hypertension: Secondary | ICD-10-CM | POA: Insufficient documentation

## 2019-08-01 DIAGNOSIS — M549 Dorsalgia, unspecified: Secondary | ICD-10-CM | POA: Diagnosis not present

## 2019-08-01 MED ORDER — PREDNISONE 10 MG (21) PO TBPK: ORAL_TABLET | ORAL | 0 refills | Status: DC

## 2019-08-01 MED ORDER — AMLODIPINE BESYLATE 5 MG PO TABS: 5.0000 mg | ORAL_TABLET | Freq: Every day | ORAL | 1 refills | Status: DC

## 2019-08-01 NOTE — Telephone Encounter (Signed)
Yes OK. thanks

## 2019-08-01 NOTE — Telephone Encounter (Signed)
Patient called. He never picked up the  PredniSONE prescribed to him by Dr.Yates on 07/21/2019.   He is now trying to and his pharmacy doesn't have it.     Call back number: [phone]

## 2019-08-01 NOTE — Patient Instructions (Signed)
Take blood pressure readings first thing in the morning Take prednisone as directed by spine specialist. Obtain MRI as recommended by spine specialist

## 2019-08-01 NOTE — Progress Notes (Signed)
.  Virtual Visit via Telephone Note  I connected with Lorin Picket on 08/01/19 at  8:20 AM EST by telephone and verified that I am speaking with the correct person using two identifiers.DOB/address  Location: Patient:home  Provider: office   I discussed the limitations, risks, security and privacy concerns of performing an evaluation and management service by telephone and the availability of in person appointments. I also discussed with the patient that there may be a patient responsible charge related to this service. The patient expressed understanding and agreed to proceed.   History of Present Illness:   HTN-taking bp at home with elevated readings-elevated a spine surgeon office and previously in primary care office Back pain with evaluation and recommendation  Observations/Objective: Reviewed surgery office bp  Systolic 376'E Reviewed blood work Assessment and Plan: 1. Essential hypertension Start amlodipine 5mg  at night-risk/benefit/side effects d/w pt Take bp readings first thing in the morning  2. Spine pain, multilevel Reviewed note-pt has not taken prednisone as directed by spine surgeon-d/w pt increase blood pressure with prednisone-specialist sent to pharmacy. MRI for newer evaluation. Specialist note states pt off work due to back pain. D/w pt have not written pt out of work and specialist would determine need for pt to be out of work based on back symptoms. Pt acknowledges understanding   Follow Up Instructions: Write down bp reading for follow up Keep appt for MRI Take all medications as directed   I discussed the assessment and treatment plan with the patient. The patient was provided an opportunity to ask questions and all were answered. The patient agreed with the plan and demonstrated an understanding of the instructions.   The patient was advised to call back or seek an in-person evaluation if the symptoms worsen or if the condition fails to improve as  anticipated.  I provided 12 minutes of non-face-to-face time during this encounter.   Denton Derks Hannah Beat, MD

## 2019-08-01 NOTE — Telephone Encounter (Signed)
Sent to pharmacy. Patient aware.  

## 2019-08-01 NOTE — Addendum Note (Signed)
Addended by: Meyer Cory on: 08/01/2019 12:17 PM   Modules accepted: Orders

## 2019-08-01 NOTE — Telephone Encounter (Signed)
Please advise. OK to send in new rx?

## 2019-08-08 ENCOUNTER — Ambulatory Visit (HOSPITAL_COMMUNITY)
Admission: RE | Admit: 2019-08-08 | Discharge: 2019-08-08 | Disposition: A | Discharge status: Home / Self Care | Payer: BC Managed Care – PPO | Source: Ambulatory Visit | Attending: Orthopaedic Surgery | Admitting: Orthopaedic Surgery

## 2019-08-08 ENCOUNTER — Other Ambulatory Visit: Payer: Self-pay

## 2019-08-08 DIAGNOSIS — M545 Low back pain: Secondary | ICD-10-CM | POA: Diagnosis not present

## 2019-08-08 DIAGNOSIS — M4807 Spinal stenosis, lumbosacral region: Secondary | ICD-10-CM | POA: Insufficient documentation

## 2019-08-15 ENCOUNTER — Ambulatory Visit: Payer: BC Managed Care – PPO

## 2019-08-30 ENCOUNTER — Ambulatory Visit: Payer: BC Managed Care – PPO

## 2019-10-06 ENCOUNTER — Ambulatory Visit (INDEPENDENT_AMBULATORY_CARE_PROVIDER_SITE_OTHER): Payer: Self-pay | Admitting: *Deleted

## 2019-10-06 DIAGNOSIS — Z1211 Encounter for screening for malignant neoplasm of colon: Secondary | ICD-10-CM

## 2019-10-06 NOTE — Progress Notes (Signed)
Called pt for nurse triage visit.  Pt said that he needed to reschedule due to not having any insurance right now.

## 2019-12-07 ENCOUNTER — Other Ambulatory Visit: Payer: Self-pay

## 2019-12-07 ENCOUNTER — Ambulatory Visit: Payer: Self-pay

## 2020-03-01 ENCOUNTER — Ambulatory Visit: Payer: Self-pay

## 2020-04-03 ENCOUNTER — Ambulatory Visit: Payer: Self-pay

## 2020-05-14 ENCOUNTER — Ambulatory Visit: Payer: Self-pay

## 2020-06-28 ENCOUNTER — Ambulatory Visit (INDEPENDENT_AMBULATORY_CARE_PROVIDER_SITE_OTHER): Payer: Self-pay | Admitting: *Deleted

## 2020-06-28 ENCOUNTER — Other Ambulatory Visit: Payer: Self-pay

## 2020-06-28 VITALS — Ht 72.0 in | Wt 211.4 lb

## 2020-06-28 DIAGNOSIS — Z1211 Encounter for screening for malignant neoplasm of colon: Secondary | ICD-10-CM

## 2020-06-28 MED ORDER — NA SULFATE-K SULFATE-MG SULF 17.5-3.13-1.6 GM/177ML PO SOLN: 1.0000 | Freq: Once | ORAL | 0 refills | Status: DC

## 2020-06-28 NOTE — Patient Instructions (Addendum)
Gregory Marsh   November 18, 1965 MRN: [id number]    Procedure Date: 10/01/2020 Time to register: 6:00 Place to register: [facility] Procedure Time: 7:30 Scheduled provider: Dr. Abbey Chatters  PREPARATION FOR COLONOSCOPY WITH TRI-LYTE SPLIT PREP  Please notify us immediately if you are diabetic, take iron supplements, or if you are on Coumadin or any other blood thinners.   Please hold the following medications: n/a  You will need to purchase 1 fleet enema and 1 box of Bisacodyl 34m tablets.   2 DAYS BEFORE PROCEDURE:  DATE: 09/29/2020   DAY: Saturday Begin clear liquid diet AFTER your lunch meal. NO SOLID FOODS after this point.  1 DAY BEFORE PROCEDURE:  DATE: 09/30/2020   DAY: Sunday Continue clear liquids the entire day - NO SOLID FOOD.   Diabetic medications adjustments for today: n/a  At 2:00 pm:  Take 2 Bisacodyl tablets.   At 4:00pm:  Start drinking your solution. Make sure you mix well per instructions on the bottle. Try to drink 1 (one) 8 ounce glass every 10-15 minutes until you have consumed HALF the jug. You should complete by 6:00pm.You must keep the left over solution refrigerated until completed next day.  Continue clear liquids. You must drink plenty of clear liquids to prevent dehyration and kidney failure.     DAY OF PROCEDURE:   DATE: 10/01/2020   DAY: Monday If you take medications for your heart, blood pressure or breathing, you may take these medications.  Diabetic medications adjustments for today: n/a  Five hours before your procedure time @ 2:30 am:  Finish remaining amout of bowel prep, drinking 1 (one) 8 ounce glass every 10-15 minutes until complete. You have two hours to consume remaining prep.   Three hours before your procedure time @ 4:30 am:  Nothing by mouth.   At least one hour before going to the hospital:  Give yourself one Fleet enema. You may take your morning medications with sip of water unless we have instructed otherwise.      Please  see below for Dietary Information.  CLEAR LIQUIDS INCLUDE:  Water Jello (NOT red in color)   Ice Popsicles (NOT red in color)   Tea (sugar ok, no milk/cream) Powdered fruit flavored drinks  Coffee (sugar ok, no milk/cream) Gatorade/ Lemonade/ Kool-Aid  (NOT red in color)   Juice: apple, white grape, white cranberry Soft drinks  Clear bullion, consomme, broth (fat free beef/chicken/vegetable)  Carbonated beverages (any kind)  Strained chicken noodle soup Hard Candy   Remember: Clear liquids are liquids that will allow you to see your fingers on the other side of a clear glass. Be sure liquids are NOT red in color, and not cloudy, but CLEAR.  DO NOT EAT OR DRINK ANY OF THE FOLLOWING:  Dairy products of any kind   Cranberry juice Tomato juice / V8 juice   Grapefruit juice Orange juice     Red grape juice  Do not eat any solid foods, including such foods as: cereal, oatmeal, yogurt, fruits, vegetables, creamed soups, eggs, bread, crackers, pureed foods in a blender, etc.   HELPFUL HINTS FOR DRINKING PREP SOLUTION:   Make sure prep is extremely cold. Mix and refrigerate the the morning of the prep. You may also put in the freezer.   You may try mixing some Crystal Light or Country Time Lemonade if you prefer. Mix in small amounts; add more if necessary.  Try drinking through a straw  Rinse mouth with water or a  mouthwash between glasses, to remove after-taste.  Try sipping on a cold beverage /ice/ popsicles between glasses of prep.  Place a piece of sugar-free hard candy in mouth between glasses.  If you become nauseated, try consuming smaller amounts, or stretch out the time between glasses. Stop for 30-60 minutes, then slowly start back drinking.        OTHER INSTRUCTIONS  You will need a responsible adult at least 55 years of age to accompany you and drive you home. This person must remain in the waiting room during your procedure. The hospital will cancel your procedure  if you do not have a responsible adult with you.   1. Wear loose fitting clothing that is easily removed. 2. Leave jewelry and other valuables at home.  3. Remove all body piercing jewelry and leave at home. 4. Total time from sign-in until discharge is approximately 2-3 hours. 5. You should go home directly after your procedure and rest. You can resume normal activities the day after your procedure. 6. The day of your procedure you should not:  Drive  Make legal decisions  Operate machinery  Drink alcohol  Return to work   You may call the office (Dept: [phone]) before 5:00pm, or page the doctor on call [phone]) after 5:00pm, for further instructions, if necessary.   Insurance Information YOU WILL NEED TO CHECK WITH YOUR INSURANCE COMPANY FOR THE BENEFITS OF COVERAGE YOU HAVE FOR THIS PROCEDURE.  UNFORTUNATELY, NOT ALL INSURANCE COMPANIES HAVE BENEFITS TO COVER ALL OR PART OF THESE TYPES OF PROCEDURES.  IT IS YOUR RESPONSIBILITY TO CHECK YOUR BENEFITS, HOWEVER, WE WILL BE GLAD TO ASSIST YOU WITH ANY CODES YOUR INSURANCE COMPANY MAY NEED.    PLEASE NOTE THAT MOST INSURANCE COMPANIES WILL NOT COVER A SCREENING COLONOSCOPY FOR PEOPLE UNDER THE AGE OF 50  IF YOU HAVE BCBS INSURANCE, YOU MAY HAVE BENEFITS FOR A SCREENING COLONOSCOPY BUT IF POLYPS ARE FOUND THE DIAGNOSIS WILL CHANGE AND THEN YOU MAY HAVE A DEDUCTIBLE THAT WILL NEED TO BE MET. SO PLEASE MAKE SURE YOU CHECK YOUR BENEFITS FOR A SCREENING COLONOSCOPY AS WELL AS A DIAGNOSTIC COLONOSCOPY.

## 2020-06-28 NOTE — Progress Notes (Signed)
Gastroenterology Pre-Procedure Review  Request Date: 06/28/2020 Requesting Physician: Dr. Judee Clara, no previous TCS  PATIENT REVIEW QUESTIONS: The patient responded to the following health history questions as indicated:    1. Diabetes Melitis: no 2. Joint replacements in the past 12 months: no 3. Major health problems in the past 3 months: no 4. Has an artificial valve or MVP: no 5. Has a defibrillator: no 6. Has been advised in past to take antibiotics in advance of a procedure like teeth cleaning: no 7. Family history of colon cancer:  no 8. Alcohol Use: no 9. Illicit drug Use: yes, marijuana occasionally 10. History of sleep apnea: no 11. History of coronary artery or other vascular stents placed within the last 12 months: no 12. History of any prior anesthesia complications: no 13. Body mass index is 28.67 kg/m.    MEDICATIONS & ALLERGIES:    Patient reports the following regarding taking any blood thinners:   Plavix? no Aspirin? no Coumadin? no Brilinta? no Xarelto? no Eliquis? no Pradaxa? no Savaysa? no Effient? no  Patient confirms/reports the following medications:  No current outpatient medications on file.   No current facility-administered medications for this visit.    Patient confirms/reports the following allergies:  No Known Allergies  No orders of the defined types were placed in this encounter.   AUTHORIZATION INFORMATION Primary Insurance: [place],  [place] [id number] ,  Group #: [id number] Pre-Cert / Berkley Harvey required: No, submit to local BCBS  SCHEDULE INFORMATION: Procedure has been scheduled as follows:  Date: 08/20/2020, Time: 7:30 Location: [facility] with Dr. Marletta Lor  This Gastroenterology Pre-Precedure Review Form is being routed to the following provider(s): Ermalinda Memos, PA

## 2020-06-28 NOTE — Progress Notes (Signed)
OK to schedule. ASA II 

## 2020-08-17 ENCOUNTER — Other Ambulatory Visit: Payer: Self-pay

## 2020-08-17 ENCOUNTER — Other Ambulatory Visit (HOSPITAL_COMMUNITY)
Admission: RE | Admit: 2020-08-17 | Discharge: 2020-08-17 | Disposition: A | Discharge status: Home / Self Care | Payer: BC Managed Care – PPO | Source: Ambulatory Visit | Attending: Internal Medicine | Admitting: Internal Medicine

## 2020-08-17 DIAGNOSIS — Z20822 Contact with and (suspected) exposure to covid-19: Secondary | ICD-10-CM | POA: Diagnosis not present

## 2020-08-17 DIAGNOSIS — Z01812 Encounter for preprocedural laboratory examination: Secondary | ICD-10-CM | POA: Diagnosis present

## 2020-08-17 LAB — SARS CORONAVIRUS 2 (TAT 6-24 HRS): SARS Coronavirus 2: NEGATIVE

## 2020-08-18 ENCOUNTER — Encounter (HOSPITAL_COMMUNITY): Payer: Self-pay | Admitting: Anesthesiology

## 2020-08-20 ENCOUNTER — Telehealth: Payer: Self-pay | Admitting: Internal Medicine

## 2020-08-20 MED ORDER — PEG 3350-KCL-NA BICARB-NACL 420 G PO SOLR: 4000.0000 mL | Freq: Once | ORAL | 0 refills | Status: AC

## 2020-08-20 NOTE — Telephone Encounter (Signed)
Melanie from Short Stay called to let you know that patient couldn't afford his prep and will need to be rescheduled and with a different prep.

## 2020-08-20 NOTE — OR Nursing (Signed)
Patient cancelled procedure due to prep being so expensive, he was going to have to pay over $140 out of pocket. Patient would like to see about getting a coupon or a cheaper prep. Will notify the office when they open this morning of the above and get patient rescheduled.

## 2020-08-20 NOTE — Addendum Note (Signed)
Addended by: Noreene Larsson on: 08/20/2020 02:43 PM   Modules accepted: Orders

## 2020-08-20 NOTE — Telephone Encounter (Addendum)
Called pt and he requested for me to send a different prep kit to [employer].  Rescheduled his procedure to 10/01/2020 at 7:30 with arrival at 6:00.  Pt scheduled his Covid screening for 09/28/2020 at 8:00.  Pt aware that I am mailing out new prep instructions.  Informed Hoyle Sauer of new procedure date by voice mail.
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
## 2020-09-28 ENCOUNTER — Other Ambulatory Visit (HOSPITAL_COMMUNITY)
Admission: RE | Admit: 2020-09-28 | Discharge: 2020-09-28 | Disposition: A | Discharge status: Home / Self Care | Payer: BC Managed Care – PPO | Source: Ambulatory Visit | Attending: Internal Medicine | Admitting: Internal Medicine

## 2020-09-28 NOTE — Progress Notes (Signed)
Spoke with patient,he has a job interview and cannot come in later today for covid test. I explained to the patient he would need to call his Dr.'s office and cancel his procedure (2/21). Patient understood.

## 2020-09-28 NOTE — Progress Notes (Signed)
Patient did not show for Covid test. Spoke with patient who said he had a new job and did not have health insurance yet and would like to reschedule his procedure for a later date. Will notify the office on Monday.

## 2020-10-01 ENCOUNTER — Encounter (HOSPITAL_COMMUNITY): Admission: RE | Payer: Self-pay | Source: Home / Self Care

## 2020-10-01 ENCOUNTER — Ambulatory Visit (HOSPITAL_COMMUNITY): Admission: RE | Admit: 2020-10-01 | Payer: BLUE CROSS/BLUE SHIELD | Source: Home / Self Care

## 2020-10-01 ENCOUNTER — Telehealth: Payer: Self-pay | Admitting: *Deleted

## 2020-10-01 SURGERY — COLONOSCOPY WITH PROPOFOL
Anesthesia: Monitor Anesthesia Care

## 2020-10-01 NOTE — Telephone Encounter (Signed)
Received call from endo patient was no show for procedure. Patient called and advised endo he did not have insurance and wanted to wait

## 2020-10-01 NOTE — Telephone Encounter (Signed)
Called pt back to see if he knew when he would have insurance and if he would like to reschedule procedure.  Pt said he has been having a lot of elevated blood pressures lately and would like to get this under control first.  He said he didn't have a PCP.  I gave him the number to Dr. Anthony Sar office and informed him to call us back when he is ready to reschedule procedure.  He is aware to call me back before May if possible so he wouldn't have to get a new referral.  He voiced understanding.

## 2020-10-05 ENCOUNTER — Other Ambulatory Visit: Payer: Self-pay

## 2020-10-05 ENCOUNTER — Encounter: Payer: Self-pay | Admitting: Internal Medicine

## 2020-10-05 ENCOUNTER — Ambulatory Visit (INDEPENDENT_AMBULATORY_CARE_PROVIDER_SITE_OTHER): Payer: Self-pay | Admitting: Internal Medicine

## 2020-10-05 VITALS — BP 169/96 | HR 79 | Resp 18 | Ht 72.0 in | Wt 215.8 lb

## 2020-10-05 DIAGNOSIS — Z7689 Persons encountering health services in other specified circumstances: Secondary | ICD-10-CM

## 2020-10-05 DIAGNOSIS — M75102 Unspecified rotator cuff tear or rupture of left shoulder, not specified as traumatic: Secondary | ICD-10-CM

## 2020-10-05 DIAGNOSIS — M48062 Spinal stenosis, lumbar region with neurogenic claudication: Secondary | ICD-10-CM

## 2020-10-05 DIAGNOSIS — F1290 Cannabis use, unspecified, uncomplicated: Secondary | ICD-10-CM

## 2020-10-05 DIAGNOSIS — I10 Essential (primary) hypertension: Secondary | ICD-10-CM

## 2020-10-05 MED ORDER — LOSARTAN POTASSIUM-HCTZ 50-12.5 MG PO TABS: 1.0000 | ORAL_TABLET | Freq: Every day | ORAL | 0 refills | Status: DC

## 2020-10-05 NOTE — Assessment & Plan Note (Signed)
S/p arthroscopic repair No acute shoulder pain currently

## 2020-10-05 NOTE — Assessment & Plan Note (Signed)
Uses to see Orthopedic surgeon Wants to wait for further evaluation Tylenol PRN Simple back exercises

## 2020-10-05 NOTE — Assessment & Plan Note (Signed)
BP Readings from Last 1 Encounters:  10/05/20 (!) 169/96   uncontrolled due to noncompliance Was started on Amlodipine in the past Started Losartan-HCTZ 50-12.5 mg Counseled for compliance with the medications Advised DASH diet and moderate exercise/walking, at least 150 mins/week

## 2020-10-05 NOTE — Assessment & Plan Note (Signed)
Care established Previous chart reviewed History and medications reviewed with the patient 

## 2020-10-05 NOTE — Patient Instructions (Signed)
Please start taking Losartan-HCTZ as prescribed.  Please follow DASH diet and perform moderate exercise at least 150 mins/week.  DASH stands for Dietary Approaches to Stop Hypertension. The DASH diet is a healthy-eating plan designed to help treat or prevent high blood pressure (hypertension).  The DASH diet includes foods that are rich in potassium, calcium and magnesium. These nutrients help control blood pressure. The diet limits foods that are high in sodium, saturated fat and added sugars.  Studies have shown that the DASH diet can lower blood pressure in as little as two weeks. The diet can also lower low-density lipoprotein (LDL or "bad") cholesterol levels in the blood. High blood pressure and high LDL cholesterol levels are two major risk factors for heart disease and stroke.    DASH diet: Recommended servings The DASH diet provides daily and weekly nutritional goals. The number of servings you should have depends on your daily calorie needs.  Here's a look at the recommended servings from each food group for a 2,000-calorie-a-day DASH diet:  Grains: 6 to 8 servings a day. One serving is one slice bread, 1 ounce dry cereal, or 1/2 cup cooked cereal, rice or pasta. Vegetables: 4 to 5 servings a day. One serving is 1 cup raw leafy green vegetable, 1/2 cup cut-up raw or cooked vegetables, or 1/2 cup vegetable juice. Fruits: 4 to 5 servings a day. One serving is one medium fruit, 1/2 cup fresh, frozen or canned fruit, or 1/2 cup fruit juice. Fat-free or low-fat dairy products: 2 to 3 servings a day. One serving is 1 cup milk or yogurt, or 1 1/2 ounces cheese. Lean meats, poultry and fish: six 1-ounce servings or fewer a day. One serving is 1 ounce cooked meat, poultry or fish, or 1 egg. Nuts, seeds and legumes: 4 to 5 servings a week. One serving is 1/3 cup nuts, 2 tablespoons peanut butter, 2 tablespoons seeds, or 1/2 cup cooked legumes (dried beans or peas). Fats and oils: 2 to 3  servings a day. One serving is 1 teaspoon soft margarine, 1 teaspoon vegetable oil, 1 tablespoon mayonnaise or 2 tablespoons salad dressing. Sweets and added sugars: 5 servings or fewer a week. One serving is 1 tablespoon sugar, jelly or jam, 1/2 cup sorbet, or 1 cup lemonade.

## 2020-10-05 NOTE — Progress Notes (Signed)
New Patient Office Visit  Subjective:  Patient ID: Gregory Marsh, male    DOB: [birth year]  Age: 55 y.o. MRN: [id number]  CC:  Chief Complaint  Patient presents with  . New Patient (Initial Visit)    New patient was being seen by dr Judee Clara applied for job and his bp was too high they told him that he needed to get this down was taking amlodipine in the past but quit after seeing corum     HPI Gregory Marsh is a 55 year old male with PMH of HTN and lumbar spinal stenosis who presents for establishing care.  He recently went for preemployment checkup and was found to have very high BP and was told to have PCP visit. His BP was elevated today. Of note, he was started on Amlodipine by Dr Judee Clara in the past, but he did not continue to take it as he felt his BP was getting better. He denies any headache, dizziness, chest pain, dyspnea or palpitations.  He has h/o chronic low back pain, and was diagnosed with lumbar spinal stenosis. He has visited Spine specialist in the past, but wants to wait for now for further evaluation. He used to have numbness over his b/l LE, but back pain has not been bothering him much lately.  He has had 2 doses of COVID vaccine. Has not had flu vaccine.  Past Medical History:  Diagnosis Date  . AC separation, type 3, left, sequela 12/10/2011  . Articular cartilage disorder, shoulder region 01/13/2012  . Primary localized osteoarthrosis, shoulder region 01/13/2012    Past Surgical History:  Procedure Laterality Date  . SHOULDER ARTHROSCOPY  01/09/2012   Procedure: ARTHROSCOPY SHOULDER;  Surgeon: Vickki Hearing, MD;  Location: [facility];  Service: Orthopedics;  Laterality: Left;  with debridement  . SHOULDER OPEN ROTATOR CUFF REPAIR  01/09/2012   Procedure: ROTATOR CUFF REPAIR SHOULDER OPEN;  Surgeon: Vickki Hearing, MD;  Location: [facility];  Service: Orthopedics;  Laterality: Left;  with distal clavicle excision    Family History  Problem Relation Age of Onset  .  Diabetes Other   . Diabetes Mother   . Anesthesia problems Neg Hx   . Hypotension Neg Hx   . Malignant hyperthermia Neg Hx   . Pseudochol deficiency Neg Hx     Social History   Socioeconomic History  . Marital status: Married    Spouse name: Not on file  . Number of children: Not on file  . Years of education: 33  . Highest education level: Not on file  Occupational History    Employer: [employer]  Tobacco Use  . Smoking status: Light Tobacco Smoker    Years: 10.00    Types: Cigars  . Smokeless tobacco: Never Used  Substance and Sexual Activity  . Alcohol use: Yes    Alcohol/week: 6.0 standard drinks    Types: 6 Cans of beer per week    Comment: occasionally  . Drug use: Yes    Types: Marijuana  . Sexual activity: Yes    Birth control/protection: None  Other Topics Concern  . Not on file  Social History Narrative  . Not on file   Social Determinants of Health   Financial Resource Strain: Not on file  Food Insecurity: Not on file  Transportation Needs: Not on file  Physical Activity: Not on file  Stress: Not on file  Social Connections: Not on file  Intimate Partner Violence: Not on file    ROS  Review of Systems  Constitutional: Negative for chills and fever.  HENT: Negative for congestion and sore throat.   Eyes: Negative for pain and discharge.  Respiratory: Negative for cough and shortness of breath.   Cardiovascular: Negative for chest pain and palpitations.  Gastrointestinal: Negative for constipation, diarrhea, nausea and vomiting.  Endocrine: Negative for polydipsia and polyuria.  Genitourinary: Negative for dysuria and hematuria.  Musculoskeletal: Positive for back pain. Negative for neck pain and neck stiffness.  Skin: Negative for rash.  Neurological: Negative for dizziness, weakness, numbness and headaches.  Psychiatric/Behavioral: Negative for agitation and behavioral problems.    Objective:   Today's Vitals: BP (!) 169/96 (BP Location:  Left Arm, Patient Position: Sitting, Cuff Size: Normal)   Pulse 79   Resp 18   Ht 6' (1.829 m)   Wt 215 lb 12.8 oz (97.9 kg)   SpO2 98%   BMI 29.27 kg/m   Physical Exam Vitals reviewed.  Constitutional:      General: He is not in acute distress.    Appearance: He is not diaphoretic.  HENT:     Head: Normocephalic and atraumatic.     Nose: Nose normal.     Mouth/Throat:     Mouth: Mucous membranes are moist.  Eyes:     General: No scleral icterus.    Extraocular Movements: Extraocular movements intact.     Pupils: Pupils are equal, round, and reactive to light.  Cardiovascular:     Rate and Rhythm: Normal rate and regular rhythm.     Pulses: Normal pulses.     Heart sounds: Normal heart sounds. No murmur heard.   Pulmonary:     Breath sounds: Normal breath sounds. No wheezing or rales.  Musculoskeletal:     Cervical back: Neck supple. No tenderness.     Right lower leg: No edema.     Left lower leg: No edema.  Skin:    General: Skin is warm.     Findings: No rash.  Neurological:     General: No focal deficit present.     Mental Status: He is alert and oriented to person, place, and time.  Psychiatric:        Mood and Affect: Mood normal.        Behavior: Behavior normal.     Assessment & Plan:   Problem List Items Addressed This Visit      Encounter to establish care - Primary   Care established Previous chart reviewed History and medications reviewed with the patient      Cardiovascular and Mediastinum   Essential hypertension    BP Readings from Last 1 Encounters:  10/05/20 (!) 169/96   uncontrolled due to noncompliance Was started on Amlodipine in the past Started Losartan-HCTZ 50-12.5 mg Counseled for compliance with the medications Advised DASH diet and moderate exercise/walking, at least 150 mins/week       Relevant Medications   losartan-hydrochlorothiazide (HYZAAR) 50-12.5 MG tablet     Musculoskeletal and Integument   Rotator cuff  tear    S/p arthroscopic repair No acute shoulder pain currently        Other   Spinal stenosis of lumbar region    Uses to see Orthopedic surgeon Wants to wait for further evaluation Tylenol PRN Simple back exercises      Marijuana use Advised to avoid daily use Continue to abstain from smoking  Outpatient Encounter Medications as of 10/05/2020  Medication Sig  . losartan-hydrochlorothiazide (HYZAAR) 50-12.5 MG tablet Take 1 tablet  by mouth daily.   No facility-administered encounter medications on file as of 10/05/2020.    Follow-up: Return in about 2 weeks (around 10/19/2020).   Anabel Halon, MD

## 2020-10-26 ENCOUNTER — Ambulatory Visit (INDEPENDENT_AMBULATORY_CARE_PROVIDER_SITE_OTHER): Payer: Self-pay | Admitting: Internal Medicine

## 2020-10-26 ENCOUNTER — Other Ambulatory Visit: Payer: Self-pay

## 2020-10-26 ENCOUNTER — Encounter: Payer: Self-pay | Admitting: Internal Medicine

## 2020-10-26 VITALS — BP 127/87 | HR 88 | Resp 18 | Ht 72.0 in | Wt 205.0 lb

## 2020-10-26 DIAGNOSIS — I10 Essential (primary) hypertension: Secondary | ICD-10-CM

## 2020-10-26 DIAGNOSIS — M48062 Spinal stenosis, lumbar region with neurogenic claudication: Secondary | ICD-10-CM

## 2020-10-26 MED ORDER — GABAPENTIN 100 MG PO CAPS: 100.0000 mg | ORAL_CAPSULE | Freq: Two times a day (BID) | ORAL | 3 refills | Status: DC

## 2020-10-26 MED ORDER — LOSARTAN POTASSIUM-HCTZ 50-12.5 MG PO TABS
1.0000 | ORAL_TABLET | Freq: Every day | ORAL | 1 refills | Status: DC
Start: 2020-10-26 — End: 2021-04-29

## 2020-10-26 NOTE — Patient Instructions (Signed)
Please continue taking medication as prescribed for BP.  Please start taking Gabapentin as prescribed for back pain.  Continue to perform simple back exercises. Use back brace at work. Okay to apply heating pads for local swelling.  You are being referred to Orthopedic surgeon for further evaluation of back pain.

## 2020-10-26 NOTE — Assessment & Plan Note (Signed)
BP Readings from Last 1 Encounters:  10/26/20 127/87   Well-controlled with Losartan-HCTZ 50-12.5 mg Counseled for compliance with the medications Advised DASH diet and moderate exercise/walking, at least 150 mins/week

## 2020-10-26 NOTE — Assessment & Plan Note (Signed)
Referred to Orthopedic surgeon Started Gabapentin Tylenol PRN Simple back exercises

## 2020-10-26 NOTE — Progress Notes (Signed)
Established Patient Office Visit  Subjective:  Patient ID: Gregory Marsh, male    DOB: Mar 15, 1966  Age: 55 y.o. MRN: [id number]  CC:  Chief Complaint  Patient presents with  . Follow-up    2 week follow up still having back pain     HPI Gregory Marsh  is a 55 year old male with PMH of HTN and lumbar spinal stenosis who presents for follow up of HTN.  BP is well-controlled now. Takes medication regularly. Patient denies headache, dizziness, chest pain, dyspnea or palpitations.  He continues to have low back pain, which is constant, 5-8/10, radiating to his left LE and has left LE tingling. He is willing to see Orthopedic surgeon now. He states that he has started working and has to lift weight at times, which could have provoked his back pain. Denies any fever, chills, urinary or stool incontinence.  Past Medical History:  Diagnosis Date  . AC separation, type 3, left, sequela 12/10/2011  . Articular cartilage disorder, shoulder region 01/13/2012  . Primary localized osteoarthrosis, shoulder region 01/13/2012    Past Surgical History:  Procedure Laterality Date  . SHOULDER ARTHROSCOPY  01/09/2012   Procedure: ARTHROSCOPY SHOULDER;  Surgeon: Vickki Hearing, MD;  Location: [facility];  Service: Orthopedics;  Laterality: Left;  with debridement  . SHOULDER OPEN ROTATOR CUFF REPAIR  01/09/2012   Procedure: ROTATOR CUFF REPAIR SHOULDER OPEN;  Surgeon: Vickki Hearing, MD;  Location: [facility];  Service: Orthopedics;  Laterality: Left;  with distal clavicle excision    Family History  Problem Relation Age of Onset  . Diabetes Other   . Diabetes Mother   . Anesthesia problems Neg Hx   . Hypotension Neg Hx   . Malignant hyperthermia Neg Hx   . Pseudochol deficiency Neg Hx     Social History   Socioeconomic History  . Marital status: Married    Spouse name: Not on file  . Number of children: Not on file  . Years of education: 69  . Highest education level: Not on file  Occupational  History    Employer: [employer]  Tobacco Use  . Smoking status: Light Tobacco Smoker    Years: 10.00    Types: Cigars  . Smokeless tobacco: Never Used  Substance and Sexual Activity  . Alcohol use: Yes    Alcohol/week: 6.0 standard drinks    Types: 6 Cans of beer per week    Comment: occasionally  . Drug use: Yes    Types: Marijuana  . Sexual activity: Yes    Birth control/protection: None  Other Topics Concern  . Not on file  Social History Narrative  . Not on file   Social Determinants of Health   Financial Resource Strain: Not on file  Food Insecurity: Not on file  Transportation Needs: Not on file  Physical Activity: Not on file  Stress: Not on file  Social Connections: Not on file  Intimate Partner Violence: Not on file    Outpatient Medications Prior to Visit  Medication Sig Dispense Refill  . losartan-hydrochlorothiazide (HYZAAR) 50-12.5 MG tablet Take 1 tablet by mouth daily. 30 tablet 0   No facility-administered medications prior to visit.    No Known Allergies  ROS Review of Systems  Constitutional: Negative for chills and fever.  HENT: Negative for congestion and sore throat.   Eyes: Negative for pain and discharge.  Respiratory: Negative for cough and shortness of breath.   Cardiovascular: Negative for chest pain and palpitations.  Gastrointestinal: Negative for constipation, diarrhea, nausea and vomiting.  Endocrine: Negative for polydipsia and polyuria.  Genitourinary: Negative for dysuria and hematuria.  Musculoskeletal: Positive for back pain. Negative for neck pain and neck stiffness.  Skin: Negative for rash.  Neurological: Negative for dizziness, weakness, numbness and headaches.  Psychiatric/Behavioral: Negative for agitation and behavioral problems.      Objective:    Physical Exam Vitals reviewed.  Constitutional:      General: He is not in acute distress.    Appearance: He is not diaphoretic.  HENT:     Head: Normocephalic and  atraumatic.     Nose: Nose normal.     Mouth/Throat:     Mouth: Mucous membranes are moist.  Eyes:     General: No scleral icterus.    Extraocular Movements: Extraocular movements intact.     Pupils: Pupils are equal, round, and reactive to light.  Cardiovascular:     Rate and Rhythm: Normal rate and regular rhythm.     Pulses: Normal pulses.     Heart sounds: Normal heart sounds. No murmur heard.   Pulmonary:     Breath sounds: Normal breath sounds. No wheezing or rales.  Musculoskeletal:     Cervical back: Neck supple. No tenderness.     Right lower leg: No edema.     Left lower leg: No edema.  Skin:    General: Skin is warm.     Findings: No rash.  Neurological:     General: No focal deficit present.     Mental Status: He is alert and oriented to person, place, and time.  Psychiatric:        Mood and Affect: Mood normal.        Behavior: Behavior normal.     BP 127/87 (BP Location: Left Arm, Patient Position: Sitting)   Pulse 88   Resp 18   Ht 6' (1.829 m)   Wt 205 lb (93 kg)   SpO2 96%   BMI 27.80 kg/m  Wt Readings from Last 3 Encounters:  10/26/20 205 lb (93 kg)  10/05/20 215 lb 12.8 oz (97.9 kg)  06/28/20 211 lb 6.4 oz (95.9 kg)     Health Maintenance Due  Topic Date Due  . Hepatitis C Screening  Never done  . HIV Screening  Never done  . TETANUS/TDAP  Never done  . COLONOSCOPY (Pts 45-62yrs Insurance coverage will need to be confirmed)  Never done    There are no preventive care reminders to display for this patient.  No results found for: TSH Lab Results  Component Value Date   WBC 4.6 07/04/2019   HGB 13.8 07/04/2019   HCT 40.6 07/04/2019   MCV 94.9 07/04/2019   PLT 241 07/04/2019   Lab Results  Component Value Date   NA 140 07/04/2019   K 4.3 07/04/2019   CO2 29 07/04/2019   GLUCOSE 108 07/04/2019   BUN 11 07/04/2019   CREATININE 300 MG/DL 29/93/7169   BILITOT 0.5 07/04/2019   ALKPHOS 60 01/22/2010   AST 22 07/04/2019   ALT 22  07/04/2019   PROT 7.5 07/04/2019   ALBUMIN 10 MG/L 07/18/2019   CALCIUM 9.3 07/04/2019   Lab Results  Component Value Date   CHOL 197 07/04/2019   Lab Results  Component Value Date   HDL 77 07/04/2019   Lab Results  Component Value Date   LDLCALC 104 (H) 07/04/2019   Lab Results  Component Value Date   TRIG 72 07/04/2019  Lab Results  Component Value Date   CHOLHDL 2.6 07/04/2019   No results found for: HGBA1C    Assessment & Plan:   Problem List Items Addressed This Visit      Cardiovascular and Mediastinum   Essential hypertension - Primary    BP Readings from Last 1 Encounters:  10/26/20 127/87   Well-controlled with Losartan-HCTZ 50-12.5 mg Counseled for compliance with the medications Advised DASH diet and moderate exercise/walking, at least 150 mins/week      Relevant Medications   losartan-hydrochlorothiazide (HYZAAR) 50-12.5 MG tablet     Other   Spinal stenosis of lumbar region    Referred to Orthopedic surgeon Started Gabapentin Tylenol PRN Simple back exercises      Relevant Medications   gabapentin (NEURONTIN) 100 MG capsule   Other Relevant Orders   Ambulatory referral to Orthopedic Surgery      Meds ordered this encounter  Medications  . gabapentin (NEURONTIN) 100 MG capsule    Sig: Take 1 capsule (100 mg total) by mouth 2 (two) times daily.    Dispense:  60 capsule    Refill:  3  . losartan-hydrochlorothiazide (HYZAAR) 50-12.5 MG tablet    Sig: Take 1 tablet by mouth daily.    Dispense:  90 tablet    Refill:  1    Follow-up: Return in about 6 months (around 04/28/2021).    Anabel Halon, MD

## 2021-04-29 ENCOUNTER — Encounter: Payer: Self-pay | Admitting: Internal Medicine

## 2021-04-29 ENCOUNTER — Ambulatory Visit (INDEPENDENT_AMBULATORY_CARE_PROVIDER_SITE_OTHER): Payer: Self-pay | Admitting: Internal Medicine

## 2021-04-29 ENCOUNTER — Other Ambulatory Visit: Payer: Self-pay

## 2021-04-29 VITALS — BP 156/98 | HR 70 | Resp 18 | Ht 72.0 in | Wt 208.0 lb

## 2021-04-29 DIAGNOSIS — M48062 Spinal stenosis, lumbar region with neurogenic claudication: Secondary | ICD-10-CM

## 2021-04-29 DIAGNOSIS — Z23 Encounter for immunization: Secondary | ICD-10-CM

## 2021-04-29 DIAGNOSIS — I10 Essential (primary) hypertension: Secondary | ICD-10-CM

## 2021-04-29 MED ORDER — LOSARTAN POTASSIUM-HCTZ 50-12.5 MG PO TABS
1.0000 | ORAL_TABLET | Freq: Every day | ORAL | 0 refills | Status: DC
Start: 2021-04-29 — End: 2021-07-31

## 2021-04-29 NOTE — Assessment & Plan Note (Signed)
Had referred to Orthopedic surgeon, did not follow up - will refer again when he has insurance Continue Gabapentin Tylenol PRN Simple back exercises

## 2021-04-29 NOTE — Assessment & Plan Note (Signed)
BP Readings from Last 1 Encounters:  04/29/21 (!) 156/98   Uncontrolled due to noncompliance, refilled Losartan-HCTZ 50-12.5 mg Counseled for compliance with the medications Advised DASH diet and moderate exercise/walking, at least 150 mins/week

## 2021-04-29 NOTE — Patient Instructions (Signed)
Please continue taking Losartan-HCTZ as prescribed for blood pressure.  Please continue to follow low salt diet and perform moderate exercise/walking at least 150 mins/week.

## 2021-04-29 NOTE — Progress Notes (Signed)
Established Patient Office Visit  Subjective:  Patient ID: Gregory Marsh, male    DOB: [birth year]  Age: 55 y.o. MRN: [id number]  CC:  Chief Complaint  Patient presents with   Follow-up    6 month follow up pt has been having back pain all the time also has been off of his bp meds    HPI Gregory Marsh is a 55 y.o. male with PMH of HTN and lumbar spinal stenosis who presents for f/u of his chronic medical conditions.  HTN: His BP was elevated in the office today.  He has not been taking his losartan HCTZ.  He denies any headache, dizziness, chest pain, dyspnea or palpitations.  His BP was around 190s/100s at his workplace.  He continues to have chronic low back pain, which radiates to his left LE.  He has history of lumbar spinal stenosis, for which he was referred to Orthopedic surgeon, but he did not follow up as he does not have insurance.  He denies any saddle anesthesia, urinary or stool incontinence currently.  He received flu vaccine in the office today. Past Medical History:  Diagnosis Date   AC separation, type 3, left, sequela 12/10/2011   Articular cartilage disorder, shoulder region 01/13/2012   Primary localized osteoarthrosis, shoulder region 01/13/2012    Past Surgical History:  Procedure Laterality Date   SHOULDER ARTHROSCOPY  01/09/2012   Procedure: ARTHROSCOPY SHOULDER;  Surgeon: Vickki Hearing, MD;  Location: [facility];  Service: Orthopedics;  Laterality: Left;  with debridement   SHOULDER OPEN ROTATOR CUFF REPAIR  01/09/2012   Procedure: ROTATOR CUFF REPAIR SHOULDER OPEN;  Surgeon: Vickki Hearing, MD;  Location: [facility];  Service: Orthopedics;  Laterality: Left;  with distal clavicle excision    Family History  Problem Relation Age of Onset   Diabetes Other    Diabetes Mother    Anesthesia problems Neg Hx    Hypotension Neg Hx    Malignant hyperthermia Neg Hx    Pseudochol deficiency Neg Hx     Social History   Socioeconomic History   Marital status:  Married    Spouse name: Not on file   Number of children: Not on file   Years of education: 12   Highest education level: Not on file  Occupational History    Employer: [employer]  Tobacco Use   Smoking status: Light Smoker    Types: Cigars   Smokeless tobacco: Never  Substance and Sexual Activity   Alcohol use: Yes    Alcohol/week: 6.0 standard drinks    Types: 6 Cans of beer per week    Comment: occasionally   Drug use: Yes    Types: Marijuana   Sexual activity: Yes    Birth control/protection: None  Other Topics Concern   Not on file  Social History Narrative   Not on file   Social Determinants of Health   Financial Resource Strain: Not on file  Food Insecurity: Not on file  Transportation Needs: Not on file  Physical Activity: Not on file  Stress: Not on file  Social Connections: Not on file  Intimate Partner Violence: Not on file    Outpatient Medications Prior to Visit  Medication Sig Dispense Refill   gabapentin (NEURONTIN) 100 MG capsule Take 1 capsule (100 mg total) by mouth 2 (two) times daily. 60 capsule 3   losartan-hydrochlorothiazide (HYZAAR) 50-12.5 MG tablet Take 1 tablet by mouth daily. (Patient not taking: Reported on 04/29/2021) 90 tablet 1  No facility-administered medications prior to visit.    No Known Allergies  ROS Review of Systems  Constitutional:  Negative for chills and fever.  HENT:  Negative for congestion and sore throat.   Eyes:  Negative for pain and discharge.  Respiratory:  Negative for cough and shortness of breath.   Cardiovascular:  Negative for chest pain and palpitations.  Gastrointestinal:  Negative for constipation, diarrhea, nausea and vomiting.  Endocrine: Negative for polydipsia and polyuria.  Genitourinary:  Negative for dysuria and hematuria.  Musculoskeletal:  Positive for back pain. Negative for neck pain and neck stiffness.  Skin:  Negative for rash.  Neurological:  Negative for dizziness, weakness, numbness  and headaches.  Psychiatric/Behavioral:  Negative for agitation and behavioral problems.      Objective:    Physical Exam Vitals reviewed.  Constitutional:      General: He is not in acute distress.    Appearance: He is not diaphoretic.  HENT:     Head: Normocephalic and atraumatic.     Nose: Nose normal.     Mouth/Throat:     Mouth: Mucous membranes are moist.  Eyes:     General: No scleral icterus.    Extraocular Movements: Extraocular movements intact.  Cardiovascular:     Rate and Rhythm: Normal rate and regular rhythm.     Pulses: Normal pulses.     Heart sounds: Normal heart sounds. No murmur heard. Pulmonary:     Breath sounds: Normal breath sounds. No wheezing or rales.  Musculoskeletal:     Cervical back: Neck supple. No tenderness.     Right lower leg: No edema.     Left lower leg: No edema.  Skin:    General: Skin is warm.     Findings: No rash.  Neurological:     General: No focal deficit present.     Mental Status: He is alert and oriented to person, place, and time.  Psychiatric:        Mood and Affect: Mood normal.        Behavior: Behavior normal.    BP (!) 156/98 (BP Location: Left Arm, Cuff Size: Normal)   Pulse 70   Resp 18   Ht 6' (1.829 m)   Wt 208 lb 0.6 oz (94.4 kg)   SpO2 97%   BMI 28.22 kg/m  Wt Readings from Last 3 Encounters:  04/29/21 208 lb 0.6 oz (94.4 kg)  10/26/20 205 lb (93 kg)  10/05/20 215 lb 12.8 oz (97.9 kg)     Health Maintenance Due  Topic Date Due   HIV Screening  Never done   Hepatitis C Screening  Never done   TETANUS/TDAP  Never done   Zoster Vaccines- Shingrix (1 of 2) Never done   COLONOSCOPY (Pts 45-59yrs Insurance coverage will need to be confirmed)  Never done   COVID-19 Vaccine (3 - Moderna risk series) 06/19/2020    There are no preventive care reminders to display for this patient.  No results found for: TSH Lab Results  Component Value Date   WBC 4.6 07/04/2019   HGB 13.8 07/04/2019   HCT 40.6  07/04/2019   MCV 94.9 07/04/2019   PLT 241 07/04/2019   Lab Results  Component Value Date   NA 140 07/04/2019   K 4.3 07/04/2019   CO2 29 07/04/2019   GLUCOSE 108 07/04/2019   BUN 11 07/04/2019   CREATININE 300 MG/DL 67/54/4920   BILITOT 0.5 07/04/2019   ALKPHOS 60 01/22/2010  AST 22 07/04/2019   ALT 22 07/04/2019   PROT 7.5 07/04/2019   ALBUMIN 10 MG/L 07/18/2019   CALCIUM 9.3 07/04/2019   Lab Results  Component Value Date   CHOL 197 07/04/2019   Lab Results  Component Value Date   HDL 77 07/04/2019   Lab Results  Component Value Date   LDLCALC 104 (H) 07/04/2019   Lab Results  Component Value Date   TRIG 72 07/04/2019   Lab Results  Component Value Date   CHOLHDL 2.6 07/04/2019   No results found for: HGBA1C    Assessment & Plan:   Problem List Items Addressed This Visit       Cardiovascular and Mediastinum   Essential hypertension    BP Readings from Last 1 Encounters:  04/29/21 (!) 156/98  Uncontrolled due to noncompliance, refilled Losartan-HCTZ 50-12.5 mg Counseled for compliance with the medications Advised DASH diet and moderate exercise/walking, at least 150 mins/week      Relevant Medications   losartan-hydrochlorothiazide (HYZAAR) 50-12.5 MG tablet     Other   Spinal stenosis of lumbar region    Had referred to Orthopedic surgeon, did not follow up - will refer again when he has insurance Continue Gabapentin Tylenol PRN Simple back exercises      Other Visit Diagnoses     Need for immunization against influenza    -  Primary   Relevant Orders   Flu Vaccine QUAD 54mo+IM (Fluarix, Fluzone & Alfiuria Quad PF) (Completed)       Meds ordered this encounter  Medications   losartan-hydrochlorothiazide (HYZAAR) 50-12.5 MG tablet    Sig: Take 1 tablet by mouth daily.    Dispense:  90 tablet    Refill:  0    Follow-up: Return in about 3 months (around 07/29/2021) for HTN.    Anabel Halon, MD

## 2021-05-03 ENCOUNTER — Ambulatory Visit: Payer: Self-pay | Admitting: Internal Medicine

## 2021-07-31 ENCOUNTER — Other Ambulatory Visit: Payer: Self-pay

## 2021-07-31 ENCOUNTER — Ambulatory Visit (INDEPENDENT_AMBULATORY_CARE_PROVIDER_SITE_OTHER): Payer: PRIVATE HEALTH INSURANCE | Admitting: Internal Medicine

## 2021-07-31 ENCOUNTER — Encounter: Payer: Self-pay | Admitting: Internal Medicine

## 2021-07-31 VITALS — BP 130/78 | HR 100 | Resp 17 | Ht 72.0 in | Wt 213.0 lb

## 2021-07-31 DIAGNOSIS — M48062 Spinal stenosis, lumbar region with neurogenic claudication: Secondary | ICD-10-CM

## 2021-07-31 DIAGNOSIS — E782 Mixed hyperlipidemia: Secondary | ICD-10-CM | POA: Diagnosis not present

## 2021-07-31 DIAGNOSIS — I10 Essential (primary) hypertension: Secondary | ICD-10-CM | POA: Diagnosis not present

## 2021-07-31 DIAGNOSIS — Z114 Encounter for screening for human immunodeficiency virus [HIV]: Secondary | ICD-10-CM

## 2021-07-31 DIAGNOSIS — M25532 Pain in left wrist: Secondary | ICD-10-CM

## 2021-07-31 DIAGNOSIS — M25531 Pain in right wrist: Secondary | ICD-10-CM | POA: Insufficient documentation

## 2021-07-31 DIAGNOSIS — Z1159 Encounter for screening for other viral diseases: Secondary | ICD-10-CM

## 2021-07-31 MED ORDER — LOSARTAN POTASSIUM-HCTZ 50-12.5 MG PO TABS: 1.0000 | ORAL_TABLET | Freq: Every day | ORAL | 1 refills | Status: DC

## 2021-07-31 MED ORDER — GABAPENTIN 300 MG PO CAPS
300.0000 mg | ORAL_CAPSULE | Freq: Two times a day (BID) | ORAL | 5 refills | Status: DC
Start: 2021-07-31 — End: 2022-04-01

## 2021-07-31 NOTE — Assessment & Plan Note (Signed)
Could be due to overuse injury - arthritis and/or De Quervain tenosynovitis Advised to take Tylenol PRN and use heating pad PRN Referred to Orthopedic surgery

## 2021-07-31 NOTE — Assessment & Plan Note (Signed)
Had started Gabapentin 100 mg BID, still had uncontrolled pain - increased Gabapentin to 300 mg BID for now Referred to Orthopedic surgery Advised to avoid heavy lifting and frequent bending

## 2021-07-31 NOTE — Patient Instructions (Addendum)
Please start taking Gabapentin 300 mg for neuropathic/back pain.  Please continue taking Losartan-HCTZ.  Please continue to follow low salt diet and ambulate as tolerated.  You are being referred to Orthopedic surgery.

## 2021-07-31 NOTE — Assessment & Plan Note (Signed)
BP Readings from Last 1 Encounters:  07/31/21 130/78   Well-controlled now Counseled for compliance with the medications Advised DASH diet and moderate exercise/walking, at least 150 mins/week

## 2021-07-31 NOTE — Progress Notes (Signed)
Established Patient Office Visit  Subjective:  Patient ID: Gregory Marsh, male    DOB: 21-Jun-1966  Age: 55 y.o. MRN: [id number]  CC:  Chief Complaint  Patient presents with   Hypertension    Follow up     HPI Gregory Marsh is a 55 y.o. male with past medical history of HTN and lumbar spinal stenosis who presents for f/u of his chronic medical conditions.  HTN: BP is well-controlled. Takes medications regularly. Patient denies headache, dizziness, chest pain, dyspnea or palpitations.  He continues to have chronic low back pain, which radiates to his left LE.  He has history of lumbar spinal stenosis, for which he was referred to Orthopedic surgeon, but he did not follow up as he did not have insurance. He is willing to see them now.  He denies any saddle anesthesia, urinary or stool incontinence currently.  He also c/o b/l thumb and wrist pain, which is worse with movement. 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 Denies any numbness or tingling of the hand currently.  Past Medical History:  Diagnosis Date   AC separation, type 3, left, sequela 12/10/2011   Articular cartilage disorder, shoulder region 01/13/2012   Primary localized osteoarthrosis, shoulder region 01/13/2012    Past Surgical History:  Procedure Laterality Date   SHOULDER ARTHROSCOPY  01/09/2012   Procedure: ARTHROSCOPY SHOULDER;  Surgeon: Carole Civil, MD;  Location: [facility];  Service: Orthopedics;  Laterality: Left;  with debridement   SHOULDER OPEN ROTATOR CUFF REPAIR  01/09/2012   Procedure: ROTATOR CUFF REPAIR SHOULDER OPEN;  Surgeon: Carole Civil, MD;  Location: [facility];  Service: Orthopedics;  Laterality: Left;  with distal clavicle excision    Family History  Problem Relation Age of Onset   Diabetes Other    Diabetes Mother    Anesthesia problems Neg Hx    Hypotension Neg Hx    Malignant hyperthermia Neg Hx    Pseudochol deficiency  Neg Hx     Social History   Socioeconomic History   Marital status: Married    Spouse name: Not on file   Number of children: Not on file   Years of education: 12   Highest education level: Not on file  Occupational History    Employer: [employer]  Tobacco Use   Smoking status: Light Smoker    Types: Cigars   Smokeless tobacco: Never  Substance and Sexual Activity   Alcohol use: Yes    Alcohol/week: 6.0 standard drinks    Types: 6 Cans of beer per week    Comment: occasionally   Drug use: Yes    Types: Marijuana   Sexual activity: Yes    Birth control/protection: None  Other Topics Concern   Not on file  Social History Narrative   Not on file   Social Determinants of Health   Financial Resource Strain: Not on file  Food Insecurity: Not on file  Transportation Needs: Not on file  Physical Activity: Not on file  Stress: Not on file  Social Connections: Not on file  Intimate Partner Violence: Not on file    Outpatient Medications Prior to Visit  Medication Sig Dispense Refill   losartan-hydrochlorothiazide (HYZAAR) 50-12.5 MG tablet Take 1 tablet by mouth daily. 90 tablet 0   gabapentin (NEURONTIN) 100 MG capsule Take 1 capsule (100 mg total) by mouth 2 (two) times daily. (Patient not taking: Reported on 07/31/2021)  60 capsule 3   No facility-administered medications prior to visit.    No Known Allergies  ROS Review of Systems  Constitutional:  Negative for chills and fever.  HENT:  Negative for congestion and sore throat.   Eyes:  Negative for pain and discharge.  Respiratory:  Negative for cough and shortness of breath.   Cardiovascular:  Negative for chest pain and palpitations.  Gastrointestinal:  Negative for constipation, diarrhea, nausea and vomiting.  Endocrine: Negative for polydipsia and polyuria.  Genitourinary:  Negative for dysuria and hematuria.  Musculoskeletal:  Positive for arthralgias (B/l wrist and thumb) and back pain. Negative for neck  pain and neck stiffness.  Skin:  Negative for rash.  Neurological:  Negative for dizziness, weakness, numbness and headaches.  Psychiatric/Behavioral:  Negative for agitation and behavioral problems.      Objective:    Physical Exam Vitals reviewed.  Constitutional:      General: He is not in acute distress.    Appearance: He is not diaphoretic.  HENT:     Head: Normocephalic and atraumatic.     Nose: Nose normal.     Mouth/Throat:     Mouth: Mucous membranes are moist.  Eyes:     General: No scleral icterus.    Extraocular Movements: Extraocular movements intact.  Cardiovascular:     Rate and Rhythm: Normal rate and regular rhythm.     Pulses: Normal pulses.     Heart sounds: Normal heart sounds. No murmur heard. Pulmonary:     Breath sounds: Normal breath sounds. No wheezing or rales.  Musculoskeletal:        General: Swelling (R wrist with tenderness) and tenderness (Lumbar spine area) present.     Cervical back: Neck supple. No tenderness.     Right lower leg: No edema.     Left lower leg: No edema.  Skin:    General: Skin is warm.     Findings: No rash.  Neurological:     General: No focal deficit present.     Mental Status: He is alert and oriented to person, place, and time.  Psychiatric:        Mood and Affect: Mood normal.        Behavior: Behavior normal.    BP 130/78    Pulse 100    Resp 17    Ht 6' (1.829 m)    Wt 213 lb (96.6 kg)    SpO2 97%    BMI 28.89 kg/m  Wt Readings from Last 3 Encounters:  07/31/21 213 lb (96.6 kg)  04/29/21 208 lb 0.6 oz (94.4 kg)  10/26/20 205 lb (93 kg)    No results found for: TSH Lab Results  Component Value Date   WBC 4.6 07/04/2019   HGB 13.8 07/04/2019   HCT 40.6 07/04/2019   MCV 94.9 07/04/2019   PLT 241 07/04/2019   Lab Results  Component Value Date   NA 140 07/04/2019   K 4.3 07/04/2019   CO2 29 07/04/2019   GLUCOSE 108 07/04/2019   BUN 11 07/04/2019   CREATININE 300 MG/DL 07/18/2019   BILITOT 0.5  07/04/2019   ALKPHOS 60 01/22/2010   AST 22 07/04/2019   ALT 22 07/04/2019   PROT 7.5 07/04/2019   ALBUMIN 10 MG/L 07/18/2019   CALCIUM 9.3 07/04/2019   Lab Results  Component Value Date   CHOL 197 07/04/2019   Lab Results  Component Value Date   HDL 77 07/04/2019   Lab Results  Component Value Date   LDLCALC 104 (H) 07/04/2019   Lab Results  Component Value Date   TRIG 72 07/04/2019   Lab Results  Component Value Date   CHOLHDL 2.6 07/04/2019   No results found for: HGBA1C    Assessment & Plan:   Problem List Items Addressed This Visit       Cardiovascular and Mediastinum   Essential hypertension - Primary    BP Readings from Last 1 Encounters:  07/31/21 130/78  Well-controlled now Counseled for compliance with the medications Advised DASH diet and moderate exercise/walking, at least 150 mins/week       Relevant Medications   losartan-hydrochlorothiazide (HYZAAR) 50-12.5 MG tablet   Other Relevant Orders   CMP14+EGFR   CBC with Differential/Platelet     Other   Spinal stenosis of lumbar region    Had started Gabapentin 100 mg BID, still had uncontrolled pain - increased Gabapentin to 300 mg BID for now Referred to Orthopedic surgery Advised to avoid heavy lifting and frequent bending      Relevant Medications   gabapentin (NEURONTIN) 300 MG capsule   Other Relevant Orders   Ambulatory referral to Orthopedic Surgery   Pain of both wrist joints    Could be due to overuse injury - arthritis and/or De Quervain tenosynovitis Advised to take Tylenol PRN and use heating pad PRN Referred to Orthopedic surgery       Relevant Orders   Ambulatory referral to Orthopedic Surgery   Other Visit Diagnoses     Mixed hyperlipidemia       Relevant Medications   losartan-hydrochlorothiazide (HYZAAR) 50-12.5 MG tablet   Other Relevant Orders   Lipid panel   Encounter for screening for HIV       Relevant Orders   HIV antibody (with reflex)   Need for  hepatitis C screening test       Relevant Orders   Hepatitis C Antibody       Meds ordered this encounter  Medications   losartan-hydrochlorothiazide (HYZAAR) 50-12.5 MG tablet    Sig: Take 1 tablet by mouth daily.    Dispense:  90 tablet    Refill:  1   gabapentin (NEURONTIN) 300 MG capsule    Sig: Take 1 capsule (300 mg total) by mouth 2 (two) times daily.    Dispense:  60 capsule    Refill:  5    Follow-up: Return in about 6 months (around 01/29/2022) for Annual physical.    Lindell Spar, MD

## 2021-08-02 LAB — CMP14+EGFR
ALT: 25 IU/L (ref 0–44)
AST: 27 IU/L (ref 0–40)
Albumin/Globulin Ratio: 1.6 (ref 1.2–2.2)
Albumin: 4.5 g/dL (ref 3.8–4.9)
Alkaline Phosphatase: 68 IU/L (ref 44–121)
BUN/Creatinine Ratio: 12 (ref 9–20)
BUN: 11 mg/dL (ref 6–24)
Bilirubin Total: 0.4 mg/dL (ref 0.0–1.2)
CO2: 25 mmol/L (ref 20–29)
Calcium: 9.6 mg/dL (ref 8.7–10.2)
Chloride: 100 mmol/L (ref 96–106)
Creatinine, Ser: 0.93 mg/dL (ref 0.76–1.27)
Globulin, Total: 2.8 g/dL (ref 1.5–4.5)
Glucose: 103 mg/dL — ABNORMAL HIGH (ref 70–99)
Potassium: 4.3 mmol/L (ref 3.5–5.2)
Sodium: 142 mmol/L (ref 134–144)
Total Protein: 7.3 g/dL (ref 6.0–8.5)
eGFR: 96 mL/min/{1.73_m2} (ref >59)

## 2021-08-02 LAB — CBC WITH DIFFERENTIAL/PLATELET
Basophils Absolute: 0 10*3/uL (ref 0.0–0.2)
Basos: 1 %
EOS (ABSOLUTE): 0.1 10*3/uL (ref 0.0–0.4)
Eos: 2 %
Hematocrit: 38.6 % (ref 37.5–51.0)
Hemoglobin: 13.3 g/dL (ref 13.0–17.7)
Immature Grans (Abs): 0 10*3/uL (ref 0.0–0.1)
Immature Granulocytes: 0 %
Lymphocytes Absolute: 3 10*3/uL (ref 0.7–3.1)
Lymphs: 44 %
MCH: 32.3 pg (ref 26.6–33.0)
MCHC: 34.5 g/dL (ref 31.5–35.7)
MCV: 94 fL (ref 79–97)
Monocytes Absolute: 0.8 10*3/uL (ref 0.1–0.9)
Monocytes: 12 %
Neutrophils Absolute: 2.6 10*3/uL (ref 1.4–7.0)
Neutrophils: 41 %
Platelets: 283 10*3/uL (ref 150–450)
RBC: 4.12 x10E6/uL — ABNORMAL LOW (ref 4.14–5.80)
RDW: 12.6 % (ref 11.6–15.4)
WBC: 6.5 10*3/uL (ref 3.4–10.8)

## 2021-08-02 LAB — HIV ANTIBODY (ROUTINE TESTING W REFLEX): HIV Screen 4th Generation wRfx: NONREACTIVE

## 2021-08-02 LAB — HEPATITIS C ANTIBODY: Hep C Virus Ab: <0.1 s/co ratio (ref 0.0–0.9)

## 2021-08-02 LAB — LIPID PANEL
Chol/HDL Ratio: 2.9 ratio (ref 0.0–5.0)
Cholesterol, Total: 195 mg/dL (ref 100–199)
HDL: 68 mg/dL (ref >39)
LDL Chol Calc (NIH): 116 mg/dL — ABNORMAL HIGH (ref 0–99)
Triglycerides: 61 mg/dL (ref 0–149)
VLDL Cholesterol Cal: 11 mg/dL (ref 5–40)

## 2021-08-13 ENCOUNTER — Encounter: Payer: Self-pay | Admitting: Orthopaedic Surgery

## 2021-08-13 ENCOUNTER — Ambulatory Visit: Payer: PRIVATE HEALTH INSURANCE

## 2021-08-13 ENCOUNTER — Other Ambulatory Visit: Payer: Self-pay

## 2021-08-13 ENCOUNTER — Ambulatory Visit (INDEPENDENT_AMBULATORY_CARE_PROVIDER_SITE_OTHER): Payer: PRIVATE HEALTH INSURANCE | Admitting: Orthopaedic Surgery

## 2021-08-13 VITALS — BP 152/101 | HR 79 | Ht 72.0 in | Wt 212.8 lb

## 2021-08-13 DIAGNOSIS — M25532 Pain in left wrist: Secondary | ICD-10-CM | POA: Diagnosis not present

## 2021-08-13 DIAGNOSIS — M25531 Pain in right wrist: Secondary | ICD-10-CM

## 2021-08-13 MED ORDER — PREDNISONE 5 MG (21) PO TBPK: ORAL_TABLET | ORAL | 0 refills | Status: DC
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
## 2021-08-13 NOTE — Patient Instructions (Signed)
Aspercreme, Biofreeze, Blue Emu or Voltaren Gel over the counter 2-3 times daily. Rub into area well each use for best results.  

## 2021-08-20 ENCOUNTER — Ambulatory Visit (INDEPENDENT_AMBULATORY_CARE_PROVIDER_SITE_OTHER): Payer: PRIVATE HEALTH INSURANCE | Admitting: Orthopaedic Surgery

## 2021-08-20 ENCOUNTER — Encounter: Payer: Self-pay | Admitting: Orthopaedic Surgery

## 2021-08-20 ENCOUNTER — Other Ambulatory Visit: Payer: Self-pay

## 2021-08-20 VITALS — BP 151/96 | HR 69 | Ht 72.0 in | Wt 213.4 lb

## 2021-08-20 DIAGNOSIS — M25532 Pain in left wrist: Secondary | ICD-10-CM

## 2021-08-20 DIAGNOSIS — M25531 Pain in right wrist: Secondary | ICD-10-CM

## 2021-08-20 MED ORDER — NAPROXEN 500 MG PO TABS: 500.0000 mg | ORAL_TABLET | Freq: Two times a day (BID) | ORAL | 5 refills | Status: DC

## 2021-08-20 NOTE — Progress Notes (Signed)
I am a little better.  His wrist pain bilaterally is improved with the prednisone.  He has much better motion and less pain.  He is using the cock-up splint on the right.  I will begin Naprosyn.  He can come out of the splint as tolerated.  Right wrist has full motion and no pain of the snuff box area radially.  He has some swelling there.  NV intact.  Left wrist has full ROM and no pain today, no swelling.  Encounter Diagnosis  Name Primary?   Bilateral wrist pain Yes   Begin the Naprosyn.  Return in two weeks.  Call if any problem.  Precautions discussed.  Electronically Signed Darreld Mclean, MD 1/10/20239:17 AM

## 2021-08-20 NOTE — Patient Instructions (Addendum)
Your right wrist may still swell at times due to the arthritis you have/ along with the tendonitis in both your wrists, Dr Hilda Lias will send in new meds, for the inflammation (arthritis) pain you have. You will take it twice a day, do not double the dose if you miss a dose  Cold weather may increase your pain We will see you back in a couple weeks to see how you are doing / let us know if you need anything before then of you have problems with the meds

## 2021-09-03 ENCOUNTER — Other Ambulatory Visit: Payer: Self-pay

## 2021-09-03 ENCOUNTER — Encounter: Payer: Self-pay | Admitting: Orthopaedic Surgery

## 2021-09-03 ENCOUNTER — Ambulatory Visit (INDEPENDENT_AMBULATORY_CARE_PROVIDER_SITE_OTHER): Payer: PRIVATE HEALTH INSURANCE | Admitting: Orthopaedic Surgery

## 2021-09-03 VITALS — Ht 72.0 in | Wt 213.0 lb

## 2021-09-03 DIAGNOSIS — M25531 Pain in right wrist: Secondary | ICD-10-CM | POA: Diagnosis not present

## 2021-09-03 DIAGNOSIS — M25532 Pain in left wrist: Secondary | ICD-10-CM | POA: Diagnosis not present

## 2021-09-03 NOTE — Progress Notes (Signed)
I am much better.  He has no wrist pain today.  He has bilateral full ROM of the wrists.  NV intact.  Encounter Diagnosis  Name Primary?   Bilateral wrist pain Yes   I will see prn.  Call if any problem.  Precautions discussed.  Electronically Signed Sanjuana Kava, MD 1/24/20233:12 PM

## 2021-12-21 ENCOUNTER — Other Ambulatory Visit: Payer: Self-pay

## 2021-12-21 ENCOUNTER — Emergency Department (HOSPITAL_COMMUNITY)
Admission: EM | Admit: 2021-12-21 | Discharge: 2021-12-21 | Disposition: A | Discharge status: Home / Self Care | Payer: PRIVATE HEALTH INSURANCE | Attending: Emergency Medicine | Admitting: Emergency Medicine

## 2021-12-21 ENCOUNTER — Encounter (HOSPITAL_COMMUNITY): Payer: Self-pay | Admitting: Emergency Medicine

## 2021-12-21 DIAGNOSIS — R21 Rash and other nonspecific skin eruption: Secondary | ICD-10-CM | POA: Diagnosis present

## 2021-12-21 DIAGNOSIS — L237 Allergic contact dermatitis due to plants, except food: Secondary | ICD-10-CM | POA: Insufficient documentation

## 2021-12-21 MED ORDER — PREDNISONE 10 MG PO TABS: ORAL_TABLET | ORAL | 0 refills | Status: DC

## 2021-12-21 MED ORDER — PREDNISONE 50 MG PO TABS
60.0000 mg | ORAL_TABLET | Freq: Once | ORAL | Status: AC
  Administered 2021-12-21: 60 mg via ORAL
  Filled 2021-12-21: qty 1

## 2021-12-21 NOTE — ED Triage Notes (Signed)
Patient c/o rash with blistering form poison ivy exposure x3 weeks ago. 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

## 2021-12-21 NOTE — Discharge Instructions (Signed)
Take your next dose of prednisone tomorrow with lunch.  As discussed, additional medications that can help with itching include Benadryl, if this makes you too drowsy you can use Claritin or Zyrtec instead.  Additionally you may continue using the calamine lotion if it is helpful.  I personally prefer Goldbond anti-itch cream for a generic form of this for any areas that are particularly itchy as this is very soothing for these kinds of rashes.  Plan to see your doctor for recheck if your symptoms persist or are not improving with this treatment plan. ?

## 2021-12-21 NOTE — ED Provider Notes (Signed)
?[facility] ?Provider Note ? ? ?CSN: [id number] ?Arrival date & time: 12/21/21  1252 ? ?  ? ?History ? ?Chief Complaint  ?Patient presents with  ? Poison Ivy  ? ? ?Gregory Marsh is a 56 y.o. male with no significant past medical history presenting for evaluation of suspected poison ivy rash.  He states about 3 weeks ago he had a rash after walking into the woods behind his home to empty his lawn more clippings bag and developed a rash which he suspects was from poison ivy exposure.  He was improving although still was a little bit itchy when he helped a family member with their lawn care and now has redeveloped a pruritic rash.  He has used calamine lotion and rubbing alcohol but endorses persistent itching and now has development of new rash particularly around his ears and on his neck and forearms.  He has found no other alleviators for symptoms.  Denies fevers or chills.  He has had several small blisters which have popped and drained clear fluid but they have dried up with the use of calamine lotion. ? ?The history is provided by the patient.  ? ?  ? ?Home Medications ?Prior to Admission medications   ?Medication Sig Start Date End Date Taking? Authorizing Provider  ?predniSONE (DELTASONE) 10 MG tablet 6, 5, 4, 3, 2 then 1 tablet by mouth daily for 6 days total. 12/21/21  Yes Dalton Mille, Raynelle Fanning, PA-C  ?gabapentin (NEURONTIN) 300 MG capsule Take 1 capsule (300 mg total) by mouth 2 (two) times daily. 07/31/21   Anabel Halon, MD  ?losartan-hydrochlorothiazide (HYZAAR) 50-12.5 MG tablet Take 1 tablet by mouth daily. 07/31/21   Anabel Halon, MD  ?naproxen (NAPROSYN) 500 MG tablet Take 1 tablet (500 mg total) by mouth 2 (two) times daily with a meal. 08/20/21   Darreld Mclean, MD  ?   ? ?Allergies    ?Patient has no known allergies.   ? ?Review of Systems   ?Review of Systems  ?Constitutional:  Negative for chills and fever.  ?Respiratory:  Negative for shortness of breath and wheezing.   ?Skin:   Positive for rash.  ?Neurological:  Negative for numbness.  ?All other systems reviewed and are negative. ? ?Physical Exam ?Updated Vital Signs ?BP 126/85 (BP Location: Right Arm)   Pulse 67   Temp 97.7 ?F (36.5 ?C) (Oral)   Resp 18   Ht 6' (1.829 m)   Wt 99.8 kg   SpO2 98%   BMI 29.84 kg/m?  ?Physical Exam ?Constitutional:   ?   General: He is not in acute distress. ?   Appearance: He is well-developed.  ?HENT:  ?   Head: Normocephalic.  ?Cardiovascular:  ?   Rate and Rhythm: Normal rate.  ?Pulmonary:  ?   Effort: Pulmonary effort is normal.  ?   Breath sounds: No wheezing.  ?Musculoskeletal:     ?   General: Normal range of motion.  ?   Cervical back: Neck supple.  ?Skin: ?   Findings: Rash present. No erythema. Rash is papular and vesicular.  ?   Comments: Predominantly on forearms neck and ears.  No erythema, no purulent drainage.  No honey crusted lesions.  ? ? ?ED Results / Procedures / Treatments   ?Labs ?(all labs ordered are listed, but only abnormal results are displayed) ?Labs Reviewed - No data to display ? ?EKG ?None ? ?Radiology ?No results found. ? ?Procedures ?Procedures  ? ? ?Medications Ordered  in ED ?Medications  ?predniSONE (DELTASONE) tablet 60 mg (60 mg Oral Given 12/21/21 1349)  ? ? ?ED Course/ Medical Decision Making/ A&P ?  ?                        ?Medical Decision Making ?Patient with history and exam suggesting contact dermatitis.  There is no evidence for superficial staph infection, itchy, nonpainful lesions.  Prednisone, other home treatments including Benadryl or Claritin, Goldbond anti-itch cream discussed for symptom relief.  As needed follow-up anticipated. ? ?Risk ?Prescription drug management. ? ? ? ? ? ? ? ? ? ? ?Final Clinical Impression(s) / ED Diagnoses ?Final diagnoses:  ?Poison ivy dermatitis  ? ? ?Rx / DC Orders ?ED Discharge Orders   ? ?      Ordered  ?  predniSONE (DELTASONE) 10 MG tablet       ? 12/21/21 1339  ? ?  ?  ? ?  ? ? ?  ?Burgess Amor, PA-C ?12/21/21  1435 ? ?  ?Linwood Dibbles, MD ?12/22/21 0750 ? ?

## 2022-01-25 ENCOUNTER — Other Ambulatory Visit: Payer: Self-pay | Admitting: Internal Medicine

## 2022-01-25 DIAGNOSIS — I10 Essential (primary) hypertension: Secondary | ICD-10-CM

## 2022-02-04 ENCOUNTER — Encounter: Payer: Self-pay | Admitting: Internal Medicine

## 2022-02-04 ENCOUNTER — Ambulatory Visit (INDEPENDENT_AMBULATORY_CARE_PROVIDER_SITE_OTHER): Payer: Self-pay | Admitting: Internal Medicine

## 2022-02-04 VITALS — BP 138/88 | HR 81 | Resp 18 | Ht 72.0 in | Wt 215.0 lb

## 2022-02-04 DIAGNOSIS — Z0001 Encounter for general adult medical examination with abnormal findings: Secondary | ICD-10-CM

## 2022-02-04 DIAGNOSIS — L239 Allergic contact dermatitis, unspecified cause: Secondary | ICD-10-CM | POA: Insufficient documentation

## 2022-02-04 DIAGNOSIS — Z125 Encounter for screening for malignant neoplasm of prostate: Secondary | ICD-10-CM | POA: Insufficient documentation

## 2022-02-04 DIAGNOSIS — N529 Male erectile dysfunction, unspecified: Secondary | ICD-10-CM | POA: Insufficient documentation

## 2022-02-04 DIAGNOSIS — M48062 Spinal stenosis, lumbar region with neurogenic claudication: Secondary | ICD-10-CM

## 2022-02-04 DIAGNOSIS — I10 Essential (primary) hypertension: Secondary | ICD-10-CM

## 2022-02-04 DIAGNOSIS — E782 Mixed hyperlipidemia: Secondary | ICD-10-CM

## 2022-02-04 MED ORDER — SILDENAFIL CITRATE 50 MG PO TABS: 50.0000 mg | ORAL_TABLET | Freq: Every day | ORAL | 0 refills | Status: DC | PRN

## 2022-02-04 MED ORDER — TRIAMCINOLONE ACETONIDE 0.1 % EX CREA: 1.0000 | TOPICAL_CREAM | Freq: Two times a day (BID) | CUTANEOUS | 0 refills | Status: DC

## 2022-02-04 NOTE — Assessment & Plan Note (Signed)
Has darkening of skin over hands and forearms Kenalog cream

## 2022-02-04 NOTE — Assessment & Plan Note (Signed)
Started Viagra PRN

## 2022-02-04 NOTE — Assessment & Plan Note (Signed)
BP Readings from Last 1 Encounters:  02/04/22 138/88   Well-controlled now Counseled for compliance with the medications Advised DASH diet and moderate exercise/walking, at least 150 mins/week

## 2022-03-31 ENCOUNTER — Telehealth: Payer: Self-pay | Admitting: Orthopaedic Surgery

## 2022-03-31 ENCOUNTER — Other Ambulatory Visit: Payer: Self-pay

## 2022-03-31 ENCOUNTER — Telehealth: Payer: Self-pay | Admitting: Internal Medicine

## 2022-03-31 DIAGNOSIS — I10 Essential (primary) hypertension: Secondary | ICD-10-CM

## 2022-03-31 MED ORDER — LOSARTAN POTASSIUM-HCTZ 50-12.5 MG PO TABS: 1.0000 | ORAL_TABLET | Freq: Every day | ORAL | 1 refills | Status: DC

## 2022-03-31 NOTE — Telephone Encounter (Signed)
Patient came to office to request refill o naproxen (NAPROSYN) 500 MG tablet 60 tablet  - Last seen by Dr Hilda Lias 09/03/21. States he would also like to change pharmacy due to insurance. Please advise; patient aware Dr Hilda Lias is out of clinic this week.

## 2022-03-31 NOTE — Telephone Encounter (Signed)
Patient has changed pharmacies to [facility] [place]    Patient needs refills on   losartan-hydrochlorothiazide (HYZAAR) 50-12.5 MG tablet

## 2022-03-31 NOTE — Telephone Encounter (Signed)
Refills sent

## 2022-04-01 ENCOUNTER — Other Ambulatory Visit: Payer: Self-pay

## 2022-04-01 ENCOUNTER — Telehealth: Payer: Self-pay | Admitting: Internal Medicine

## 2022-04-01 DIAGNOSIS — M48062 Spinal stenosis, lumbar region with neurogenic claudication: Secondary | ICD-10-CM

## 2022-04-01 MED ORDER — NAPROXEN 500 MG PO TABS: 500.0000 mg | ORAL_TABLET | Freq: Two times a day (BID) | ORAL | 5 refills | Status: DC

## 2022-04-01 MED ORDER — GABAPENTIN 300 MG PO CAPS: 300.0000 mg | ORAL_CAPSULE | Freq: Two times a day (BID) | ORAL | 5 refills | Status: DC

## 2022-04-01 NOTE — Telephone Encounter (Signed)
Refilled

## 2022-04-01 NOTE — Telephone Encounter (Signed)
Patient needs refill on  gabapentin (NEURONTIN) 300 MG capsule    [facility], [address]

## 2022-07-14 ENCOUNTER — Other Ambulatory Visit: Payer: Self-pay

## 2022-07-14 ENCOUNTER — Telehealth: Payer: Self-pay | Admitting: Internal Medicine

## 2022-07-14 DIAGNOSIS — N529 Male erectile dysfunction, unspecified: Secondary | ICD-10-CM

## 2022-07-14 DIAGNOSIS — M48062 Spinal stenosis, lumbar region with neurogenic claudication: Secondary | ICD-10-CM

## 2022-07-14 DIAGNOSIS — I10 Essential (primary) hypertension: Secondary | ICD-10-CM

## 2022-07-14 MED ORDER — GABAPENTIN 300 MG PO CAPS: 300.0000 mg | ORAL_CAPSULE | Freq: Two times a day (BID) | ORAL | 5 refills | Status: DC

## 2022-07-14 MED ORDER — LOSARTAN POTASSIUM-HCTZ 50-12.5 MG PO TABS: 1.0000 | ORAL_TABLET | Freq: Every day | ORAL | 1 refills | Status: DC

## 2022-07-14 MED ORDER — SILDENAFIL CITRATE 50 MG PO TABS: 50.0000 mg | ORAL_TABLET | Freq: Every day | ORAL | 0 refills | Status: DC | PRN

## 2022-07-14 NOTE — Telephone Encounter (Signed)
Patient needs refills on   losartan-hydrochlorothiazide (HYZAAR) 50-12.5 MG tablet   gabapentin (NEURONTIN) 300 MG capsule [    sildenafil (VIAGRA) 50 MG tablet     [id number] - [place], [place] - [address] AT [facility] [address], [place] [address] Phone: [phone]  Fax: [phone]

## 2022-07-14 NOTE — Telephone Encounter (Signed)
Refills Sent

## 2022-08-06 ENCOUNTER — Encounter: Payer: Self-pay | Admitting: Internal Medicine

## 2022-08-06 ENCOUNTER — Ambulatory Visit (INDEPENDENT_AMBULATORY_CARE_PROVIDER_SITE_OTHER): Payer: BC Managed Care – PPO | Admitting: Internal Medicine

## 2022-08-06 ENCOUNTER — Encounter: Payer: Self-pay | Admitting: *Deleted

## 2022-08-06 VITALS — BP 136/86 | HR 76 | Ht 72.0 in | Wt 211.4 lb

## 2022-08-06 DIAGNOSIS — Z23 Encounter for immunization: Secondary | ICD-10-CM | POA: Diagnosis not present

## 2022-08-06 DIAGNOSIS — M48062 Spinal stenosis, lumbar region with neurogenic claudication: Secondary | ICD-10-CM | POA: Diagnosis not present

## 2022-08-06 DIAGNOSIS — I10 Essential (primary) hypertension: Secondary | ICD-10-CM

## 2022-08-06 DIAGNOSIS — Z0001 Encounter for general adult medical examination with abnormal findings: Secondary | ICD-10-CM | POA: Diagnosis not present

## 2022-08-06 DIAGNOSIS — R7303 Prediabetes: Secondary | ICD-10-CM

## 2022-08-06 DIAGNOSIS — Z1211 Encounter for screening for malignant neoplasm of colon: Secondary | ICD-10-CM

## 2022-08-06 DIAGNOSIS — E559 Vitamin D deficiency, unspecified: Secondary | ICD-10-CM

## 2022-08-06 DIAGNOSIS — E782 Mixed hyperlipidemia: Secondary | ICD-10-CM

## 2022-08-06 DIAGNOSIS — Z125 Encounter for screening for malignant neoplasm of prostate: Secondary | ICD-10-CM

## 2022-08-06 MED ORDER — CYCLOBENZAPRINE HCL 5 MG PO TABS: 5.0000 mg | ORAL_TABLET | Freq: Two times a day (BID) | ORAL | 1 refills | Status: DC | PRN

## 2022-08-06 NOTE — Progress Notes (Addendum)
Established Patient Office Visit  Subjective:  Patient ID: Gregory Marsh, male    DOB: April 15, 1966  Age: 56 y.o. MRN: [id number]  CC:  Chief Complaint  Patient presents with   Follow-up    For hypertension. Patient states he has to do a lot of lifting at work, which is starting to strain on his back and neck    Annual Exam    HPI Gregory Marsh is a 56 y.o. male with past medical history of HTN and lumbar spinal stenosis who presents for annual physical.  HTN: BP is well-controlled. Takes medications regularly. Patient denies headache, dizziness, chest pain, dyspnea or palpitations.   He continues to have chronic low back pain, which radiates to his left LE.  He admits that he has been lifting heavy weights at work and has to bend multiple times to move heavy objects, which has worsened his pain recently.  He has history of lumbar spinal stenosis. He takes Gabapentin for it. He denies any saddle anesthesia, urinary or stool incontinence currently.   He had flu vaccine and first dose of Shingrix vaccine today.  Past Medical History:  Diagnosis Date   AC separation, type 3, left, sequela 12/10/2011   Articular cartilage disorder, shoulder region 01/13/2012   Primary localized osteoarthrosis, shoulder region 01/13/2012    Past Surgical History:  Procedure Laterality Date   SHOULDER ARTHROSCOPY  01/09/2012   Procedure: ARTHROSCOPY SHOULDER;  Surgeon: Vickki Hearing, MD;  Location: [facility];  Service: Orthopedics;  Laterality: Left;  with debridement   SHOULDER OPEN ROTATOR CUFF REPAIR  01/09/2012   Procedure: ROTATOR CUFF REPAIR SHOULDER OPEN;  Surgeon: Vickki Hearing, MD;  Location: [facility];  Service: Orthopedics;  Laterality: Left;  with distal clavicle excision    Family History  Problem Relation Age of Onset   Diabetes Other    Diabetes Mother    Anesthesia problems Neg Hx    Hypotension Neg Hx    Malignant hyperthermia Neg Hx    Pseudochol deficiency Neg Hx     Social  History   Socioeconomic History   Marital status: Married    Spouse name: Not on file   Number of children: Not on file   Years of education: 12   Highest education level: Not on file  Occupational History    Employer: [employer]  Tobacco Use   Smoking status: Light Smoker    Types: Cigars   Smokeless tobacco: Never  Vaping Use   Vaping Use: Never used  Substance and Sexual Activity   Alcohol use: Yes    Alcohol/week: 6.0 standard drinks of alcohol    Types: 6 Cans of beer per week    Comment: occasionally   Drug use: Yes    Types: Marijuana   Sexual activity: Yes    Birth control/protection: None  Other Topics Concern   Not on file  Social History Narrative   Not on file   Social Determinants of Health   Financial Resource Strain: Not on file  Food Insecurity: Not on file  Transportation Needs: Not on file  Physical Activity: Not on file  Stress: Not on file  Social Connections: Not on file  Intimate Partner Violence: Not on file    Outpatient Medications Prior to Visit  Medication Sig Dispense Refill   gabapentin (NEURONTIN) 300 MG capsule Take 1 capsule (300 mg total) by mouth 2 (two) times daily. 60 capsule 5   losartan-hydrochlorothiazide (HYZAAR) 50-12.5 MG tablet Take 1  tablet by mouth daily. 90 tablet 1   naproxen (NAPROSYN) 500 MG tablet Take 1 tablet (500 mg total) by mouth 2 (two) times daily with a meal. 60 tablet 5   sildenafil (VIAGRA) 50 MG tablet Take 1 tablet (50 mg total) by mouth daily as needed for erectile dysfunction. 10 tablet 0   triamcinolone cream (KENALOG) 0.1 % Apply 1 Application topically 2 (two) times daily. 30 g 0   No facility-administered medications prior to visit.    No Known Allergies  ROS Review of Systems  Constitutional:  Negative for chills and fever.  HENT:  Negative for congestion and sore throat.   Eyes:  Negative for pain and discharge.  Respiratory:  Negative for cough and shortness of breath.   Cardiovascular:   Negative for chest pain and palpitations.  Gastrointestinal:  Negative for constipation, diarrhea, nausea and vomiting.  Endocrine: Negative for polydipsia and polyuria.  Genitourinary:  Negative for dysuria and hematuria.  Musculoskeletal:  Positive for arthralgias (B/l wrist and thumb) and back pain. Negative for neck pain and neck stiffness.  Skin:  Positive for rash.  Neurological:  Negative for dizziness, weakness, numbness and headaches.  Psychiatric/Behavioral:  Negative for agitation and behavioral problems.       Objective:    Physical Exam Vitals reviewed.  Constitutional:      General: He is not in acute distress.    Appearance: He is not diaphoretic.  HENT:     Head: Normocephalic and atraumatic.     Nose: Nose normal.     Mouth/Throat:     Mouth: Mucous membranes are moist.  Eyes:     General: No scleral icterus.    Extraocular Movements: Extraocular movements intact.  Cardiovascular:     Rate and Rhythm: Normal rate and regular rhythm.     Pulses: Normal pulses.     Heart sounds: Normal heart sounds. No murmur heard. Pulmonary:     Breath sounds: Normal breath sounds. No wheezing or rales.  Abdominal:     Palpations: Abdomen is soft.     Tenderness: There is no abdominal tenderness.  Musculoskeletal:        General: Swelling (R wrist with tenderness) and tenderness (Lumbar spine area) present.     Cervical back: Neck supple. No tenderness.     Right lower leg: No edema.     Left lower leg: No edema.  Skin:    General: Skin is warm.     Findings: Rash (Thickening of skin over b/l hands - dorsal aspect and forearms - eczematous rash) present.  Neurological:     General: No focal deficit present.     Mental Status: He is alert and oriented to person, place, and time.  Psychiatric:        Mood and Affect: Mood normal.        Behavior: Behavior normal.     BP 136/86 (BP Location: Right Arm, Cuff Size: Normal)   Pulse 76   Ht 6' (1.829 m)   Wt 211 lb  6.4 oz (95.9 kg)   SpO2 96%   BMI 28.67 kg/m  Wt Readings from Last 3 Encounters:  08/06/22 211 lb 6.4 oz (95.9 kg)  02/04/22 215 lb (97.5 kg)  12/21/21 220 lb (99.8 kg)    No results found for: "TSH" Lab Results  Component Value Date   WBC 6.5 08/01/2021   HGB 13.3 08/01/2021   HCT 38.6 08/01/2021   MCV 94 08/01/2021   PLT 283 08/01/2021  Lab Results  Component Value Date   NA 142 08/01/2021   K 4.3 08/01/2021   CO2 25 08/01/2021   GLUCOSE 103 (H) 08/01/2021   BUN 11 08/01/2021   CREATININE 0.93 08/01/2021   BILITOT 0.4 08/01/2021   ALKPHOS 68 08/01/2021   AST 27 08/01/2021   ALT 25 08/01/2021   PROT 7.3 08/01/2021   ALBUMIN 4.5 08/01/2021   CALCIUM 9.6 08/01/2021   EGFR 96 08/01/2021   Lab Results  Component Value Date   CHOL 195 08/01/2021   Lab Results  Component Value Date   HDL 68 08/01/2021   Lab Results  Component Value Date   LDLCALC 116 (H) 08/01/2021   Lab Results  Component Value Date   TRIG 61 08/01/2021   Lab Results  Component Value Date   CHOLHDL 2.9 08/01/2021   No results found for: "HGBA1C"    Assessment & Plan:   Problem List Items Addressed This Visit       Cardiovascular and Mediastinum   Essential hypertension    BP Readings from Last 1 Encounters:  08/06/22 136/86  Well-controlled now Counseled for compliance with the medications Advised DASH diet and moderate exercise/walking, at least 150 mins/week      Relevant Orders   TSH   CMP14+EGFR   CBC with Differential/Platelet     Other   Spinal stenosis of lumbar region    On Gabapentin to 300 mg BID for now Advised to avoid heavy lifting and frequent bending - advised to bring paperwork/forms from workplace for light duty work      Relevant Medications   cyclobenzaprine (FLEXERIL) 5 MG tablet   Encounter for general adult medical examination with abnormal findings - Primary    Physical exam as documented. Counseling done  re healthy lifestyle involving  commitment to 150 minutes exercise per week, heart healthy diet, and attaining healthy weight.The importance of adequate sleep also discussed. Changes in health habits are decided on by the patient with goals and time frames  set for achieving them. Immunization and cancer screening needs are specifically addressed at this visit.      Relevant Orders   TSH   Lipid panel   Hemoglobin A1c   CMP14+EGFR   CBC with Differential/Platelet   Prostate cancer screening    Ordered PSA after discussing its limitations for prostate cancer screening, including false positive results leading to additional investigations.      Relevant Orders   PSA   Other Visit Diagnoses     Colon cancer screening       Relevant Orders   Ambulatory referral to Gastroenterology   Mixed hyperlipidemia       Relevant Orders   Lipid panel   Prediabetes       Relevant Orders   Hemoglobin A1c   Vitamin D deficiency       Relevant Orders   VITAMIN D 25 Hydroxy (Vit-D Deficiency, Fractures)   Need for immunization against influenza       Relevant Orders   Flu Vaccine QUAD 14mo+IM (Fluarix, Fluzone & Alfiuria Quad PF) (Completed)   Need for zoster vaccination       Relevant Orders   Varicella-zoster vaccine IM (Completed)       Meds ordered this encounter  Medications   cyclobenzaprine (FLEXERIL) 5 MG tablet    Sig: Take 1 tablet (5 mg total) by mouth 2 (two) times daily as needed for muscle spasms.    Dispense:  30 tablet  Refill:  1    Follow-up: Return in about 5 months (around 01/05/2023) for HTN and low back pain.    Anabel Halon, MD

## 2022-08-06 NOTE — Assessment & Plan Note (Signed)
On Gabapentin to 300 mg BID for now Advised to avoid heavy lifting and frequent bending - advised to bring paperwork/forms from workplace for light duty work

## 2022-08-06 NOTE — Assessment & Plan Note (Addendum)
Ordered PSA after discussing its limitations for prostate cancer screening, including false positive results leading to additional investigations. 

## 2022-08-06 NOTE — Patient Instructions (Signed)
Please take Cyclobenzaprine as needed for muscle spasms.  Please avoid heavy lifting more than 15 lbs and frequent bending.  Please continue taking other medications as prescribed.  Please continue to follow low salt diet and ambulate as tolerated.

## 2022-08-06 NOTE — Assessment & Plan Note (Signed)

## 2022-08-06 NOTE — Assessment & Plan Note (Signed)
BP Readings from Last 1 Encounters:  08/06/22 136/86   Well-controlled now Counseled for compliance with the medications Advised DASH diet and moderate exercise/walking, at least 150 mins/week

## 2022-08-21 ENCOUNTER — Other Ambulatory Visit: Payer: Self-pay

## 2022-08-21 ENCOUNTER — Telehealth: Payer: Self-pay | Admitting: Internal Medicine

## 2022-08-21 DIAGNOSIS — N529 Male erectile dysfunction, unspecified: Secondary | ICD-10-CM

## 2022-08-21 MED ORDER — SILDENAFIL CITRATE 50 MG PO TABS: 50.0000 mg | ORAL_TABLET | Freq: Every day | ORAL | 0 refills | Status: DC | PRN

## 2022-08-21 NOTE — Telephone Encounter (Signed)
Rx sent to the pharmacy , patient advised

## 2022-08-21 NOTE — Telephone Encounter (Signed)
Patient refills on   sildenafil (VIAGRA) 50 MG tablet   gabapentin (NEURONTIN) 300 MG capsule   Wants a call  back when meds are sent in.    [id number] [place], [place] - [address] AT [facility], [place] [address] Phone: [phone]  Fax: [phone] DEA #: [id number]

## 2022-10-07 ENCOUNTER — Other Ambulatory Visit: Payer: Self-pay | Admitting: Internal Medicine

## 2022-10-07 DIAGNOSIS — N529 Male erectile dysfunction, unspecified: Secondary | ICD-10-CM

## 2022-10-07 DIAGNOSIS — M48062 Spinal stenosis, lumbar region with neurogenic claudication: Secondary | ICD-10-CM

## 2022-11-11 ENCOUNTER — Other Ambulatory Visit: Payer: Self-pay | Admitting: Internal Medicine

## 2022-11-11 ENCOUNTER — Telehealth: Payer: Self-pay | Admitting: Internal Medicine

## 2022-11-11 DIAGNOSIS — M48062 Spinal stenosis, lumbar region with neurogenic claudication: Secondary | ICD-10-CM

## 2022-11-11 MED ORDER — CYCLOBENZAPRINE HCL 5 MG PO TABS: 5.0000 mg | ORAL_TABLET | Freq: Two times a day (BID) | ORAL | 1 refills | Status: DC | PRN

## 2022-11-11 NOTE — Telephone Encounter (Signed)
Prescription Request  11/11/2022  LOV: 08/06/2022  What is the name of the medication or equipment? cyclobenzaprine (FLEXERIL) 5 MG tablet   Have you contacted your pharmacy to request a refill? No   Which pharmacy would you like this sent to?  [place], [place] [address] Phone: [phone] Fax: [phone]  [facility] [place], [address] AT [place] [place] [address] Phone: [phone] Fax: [phone]  [id number] [place], [address] [place] [address] Phone: [phone] Fax: [phone]    Patient notified that their request is being sent to the clinical staff for review and that they should receive a response within 2 business days.   Please advise at [facility] [phone]

## 2022-11-11 NOTE — Addendum Note (Signed)
Addended byIhor Dow on: 11/11/2022 09:41 AM   Modules accepted: Orders

## 2022-11-11 NOTE — Telephone Encounter (Signed)
Patient aware.

## 2022-12-09 ENCOUNTER — Other Ambulatory Visit: Payer: Self-pay | Admitting: Internal Medicine

## 2022-12-09 DIAGNOSIS — N529 Male erectile dysfunction, unspecified: Secondary | ICD-10-CM

## 2022-12-15 ENCOUNTER — Other Ambulatory Visit: Payer: Self-pay

## 2022-12-15 ENCOUNTER — Telehealth: Payer: Self-pay | Admitting: Internal Medicine

## 2022-12-15 DIAGNOSIS — M48062 Spinal stenosis, lumbar region with neurogenic claudication: Secondary | ICD-10-CM

## 2022-12-15 MED ORDER — GABAPENTIN 300 MG PO CAPS: 300.0000 mg | ORAL_CAPSULE | Freq: Two times a day (BID) | ORAL | 5 refills | Status: DC

## 2022-12-15 NOTE — Telephone Encounter (Signed)
Patient called need med refill  gabapentin (NEURONTIN) 300 MG capsule [[id number]   Pharmacy  [id number] - [place], [place] - [address] AT [facility] [address], [place] [address] Phone: [phone]  Fax: [phone] DEA #: [id number]

## 2022-12-15 NOTE — Telephone Encounter (Signed)
Prescription Request  12/15/2022  LOV: 08/06/2022  What is the name of the medication or equipment? gabapentin (NEURONTIN) 300 MG capsule   Have you contacted your pharmacy to request a refill? No   Which pharmacy would you like this sent to?   [id number] - [place], [place] - [address] AT [facility] [address] [place] [address] Phone: [phone] Fax: [phone]    Patient notified that their request is being sent to the clinical staff for review and that they should receive a response within 2 business days.   Please advise at Mobile [phone] (mobile)

## 2022-12-15 NOTE — Telephone Encounter (Signed)
Refills sent to pharmacy. 

## 2023-01-06 ENCOUNTER — Ambulatory Visit: Payer: BC Managed Care – PPO | Admitting: Internal Medicine

## 2023-01-09 DIAGNOSIS — R7303 Prediabetes: Secondary | ICD-10-CM | POA: Diagnosis not present

## 2023-01-09 DIAGNOSIS — E559 Vitamin D deficiency, unspecified: Secondary | ICD-10-CM | POA: Diagnosis not present

## 2023-01-09 DIAGNOSIS — E782 Mixed hyperlipidemia: Secondary | ICD-10-CM | POA: Diagnosis not present

## 2023-01-09 DIAGNOSIS — I10 Essential (primary) hypertension: Secondary | ICD-10-CM | POA: Diagnosis not present

## 2023-01-09 DIAGNOSIS — Z0001 Encounter for general adult medical examination with abnormal findings: Secondary | ICD-10-CM | POA: Diagnosis not present

## 2023-01-09 DIAGNOSIS — Z125 Encounter for screening for malignant neoplasm of prostate: Secondary | ICD-10-CM | POA: Diagnosis not present

## 2023-01-10 LAB — CMP14+EGFR
ALT: 30 IU/L (ref 0–44)
AST: 28 IU/L (ref 0–40)
Albumin/Globulin Ratio: 1.6 (ref 1.2–2.2)
Albumin: 4.3 g/dL (ref 3.8–4.9)
Alkaline Phosphatase: 60 IU/L (ref 44–121)
BUN/Creatinine Ratio: 15 (ref 9–20)
BUN: 14 mg/dL (ref 6–24)
Bilirubin Total: 0.3 mg/dL (ref 0.0–1.2)
CO2: 23 mmol/L (ref 20–29)
Calcium: 9 mg/dL (ref 8.7–10.2)
Chloride: 102 mmol/L (ref 96–106)
Creatinine, Ser: 0.92 mg/dL (ref 0.76–1.27)
Globulin, Total: 2.7 g/dL (ref 1.5–4.5)
Glucose: 132 mg/dL — ABNORMAL HIGH (ref 70–99)
Potassium: 3.9 mmol/L (ref 3.5–5.2)
Sodium: 141 mmol/L (ref 134–144)
Total Protein: 7 g/dL (ref 6.0–8.5)
eGFR: 97 mL/min/{1.73_m2} (ref >59)

## 2023-01-10 LAB — CBC WITH DIFFERENTIAL/PLATELET
Basophils Absolute: 0.1 10*3/uL (ref 0.0–0.2)
Basos: 1 %
EOS (ABSOLUTE): 0.1 10*3/uL (ref 0.0–0.4)
Eos: 2 %
Hematocrit: 39.4 % (ref 37.5–51.0)
Hemoglobin: 12.9 g/dL — ABNORMAL LOW (ref 13.0–17.7)
Immature Grans (Abs): 0 10*3/uL (ref 0.0–0.1)
Immature Granulocytes: 0 %
Lymphocytes Absolute: 2.8 10*3/uL (ref 0.7–3.1)
Lymphs: 53 %
MCH: 31.5 pg (ref 26.6–33.0)
MCHC: 32.7 g/dL (ref 31.5–35.7)
MCV: 96 fL (ref 79–97)
Monocytes Absolute: 0.7 10*3/uL (ref 0.1–0.9)
Monocytes: 13 %
Neutrophils Absolute: 1.6 10*3/uL (ref 1.4–7.0)
Neutrophils: 31 %
Platelets: 257 10*3/uL (ref 150–450)
RBC: 4.09 x10E6/uL — ABNORMAL LOW (ref 4.14–5.80)
RDW: 12.5 % (ref 11.6–15.4)
WBC: 5.3 10*3/uL (ref 3.4–10.8)

## 2023-01-10 LAB — LIPID PANEL
Chol/HDL Ratio: 2.7 ratio (ref 0.0–5.0)
Cholesterol, Total: 196 mg/dL (ref 100–199)
HDL: 73 mg/dL (ref >39)
LDL Chol Calc (NIH): 109 mg/dL — ABNORMAL HIGH (ref 0–99)
Triglycerides: 79 mg/dL (ref 0–149)
VLDL Cholesterol Cal: 14 mg/dL (ref 5–40)

## 2023-01-10 LAB — PSA: Prostate Specific Ag, Serum: 0.7 ng/mL (ref 0.0–4.0)

## 2023-01-10 LAB — HEMOGLOBIN A1C
Est. average glucose Bld gHb Est-mCnc: 134 mg/dL
Hgb A1c MFr Bld: 6.3 % — ABNORMAL HIGH (ref 4.8–5.6)

## 2023-01-10 LAB — VITAMIN D 25 HYDROXY (VIT D DEFICIENCY, FRACTURES): Vit D, 25-Hydroxy: 16.8 ng/mL — ABNORMAL LOW (ref 30.0–100.0)

## 2023-01-10 LAB — TSH: TSH: 3.23 u[IU]/mL (ref 0.450–4.500)

## 2023-01-12 ENCOUNTER — Ambulatory Visit (INDEPENDENT_AMBULATORY_CARE_PROVIDER_SITE_OTHER): Payer: BC Managed Care – PPO | Admitting: Internal Medicine

## 2023-01-12 ENCOUNTER — Encounter: Payer: Self-pay | Admitting: Internal Medicine

## 2023-01-12 VITALS — BP 142/88 | HR 75 | Ht 72.0 in | Wt 208.6 lb

## 2023-01-12 DIAGNOSIS — M48062 Spinal stenosis, lumbar region with neurogenic claudication: Secondary | ICD-10-CM | POA: Diagnosis not present

## 2023-01-12 DIAGNOSIS — E782 Mixed hyperlipidemia: Secondary | ICD-10-CM

## 2023-01-12 DIAGNOSIS — E1169 Type 2 diabetes mellitus with other specified complication: Secondary | ICD-10-CM | POA: Insufficient documentation

## 2023-01-12 DIAGNOSIS — I10 Essential (primary) hypertension: Secondary | ICD-10-CM

## 2023-01-12 DIAGNOSIS — Z23 Encounter for immunization: Secondary | ICD-10-CM

## 2023-01-12 DIAGNOSIS — E559 Vitamin D deficiency, unspecified: Secondary | ICD-10-CM

## 2023-01-12 DIAGNOSIS — Z1211 Encounter for screening for malignant neoplasm of colon: Secondary | ICD-10-CM

## 2023-01-12 DIAGNOSIS — R7303 Prediabetes: Secondary | ICD-10-CM | POA: Diagnosis not present

## 2023-01-12 MED ORDER — GABAPENTIN 300 MG PO CAPS: ORAL_CAPSULE | ORAL | 3 refills | Status: DC

## 2023-01-12 NOTE — Progress Notes (Addendum)
Established Patient Office Visit  Subjective:  Patient ID: Gregory Marsh, male    DOB: 1965/09/18  Age: 57 y.o. MRN: [id number]  CC:  Chief Complaint  Patient presents with   Hypertension    Five month follow up for hypertension . Patient states his pain medication for his back is no longer helping.    HPI Gregory Marsh is a 57 y.o. male with past medical history of HTN and lumbar spinal stenosis who presents for f/u of his chronic medical conditions.  HTN: BP is elevated today. Takes medications regularly. Patient denies headache, dizziness, chest pain, dyspnea or palpitations.   He continues to have chronic low back pain, which radiates to his left LE. He admits that he has been lifting heavy weights at work and has to bend multiple times to move heavy objects, which has worsened his pain recently.  He has history of lumbar spinal stenosis. He takes Gabapentin for it. He denies any saddle anesthesia, urinary or stool incontinence currently.   He c/o erectile dysfunction. Denies dysuria, hematuria or urinary hesitancy.   Past Medical History:  Diagnosis Date   AC separation, type 3, left, sequela 12/10/2011   Articular cartilage disorder, shoulder region 01/13/2012   Primary localized osteoarthrosis, shoulder region 01/13/2012    Past Surgical History:  Procedure Laterality Date   SHOULDER ARTHROSCOPY  01/09/2012   Procedure: ARTHROSCOPY SHOULDER;  Surgeon: Vickki Hearing, MD;  Location: [facility];  Service: Orthopedics;  Laterality: Left;  with debridement   SHOULDER OPEN ROTATOR CUFF REPAIR  01/09/2012   Procedure: ROTATOR CUFF REPAIR SHOULDER OPEN;  Surgeon: Vickki Hearing, MD;  Location: [facility];  Service: Orthopedics;  Laterality: Left;  with distal clavicle excision    Family History  Problem Relation Age of Onset   Diabetes Other    Diabetes Mother    Anesthesia problems Neg Hx    Hypotension Neg Hx    Malignant hyperthermia Neg Hx    Pseudochol deficiency Neg Hx      Social History   Socioeconomic History   Marital status: Married    Spouse name: Not on file   Number of children: Not on file   Years of education: 12   Highest education level: Not on file  Occupational History    Employer: [employer]  Tobacco Use   Smoking status: Light Smoker    Types: Cigars   Smokeless tobacco: Never  Vaping Use   Vaping Use: Never used  Substance and Sexual Activity   Alcohol use: Yes    Alcohol/week: 6.0 standard drinks of alcohol    Types: 6 Cans of beer per week    Comment: occasionally   Drug use: Yes    Types: Marijuana   Sexual activity: Yes    Birth control/protection: None  Other Topics Concern   Not on file  Social History Narrative   Not on file   Social Determinants of Health   Financial Resource Strain: Not on file  Food Insecurity: Not on file  Transportation Needs: Not on file  Physical Activity: Not on file  Stress: Not on file  Social Connections: Not on file  Intimate Partner Violence: Not on file    Outpatient Medications Prior to Visit  Medication Sig Dispense Refill   cyclobenzaprine (FLEXERIL) 5 MG tablet Take 1 tablet (5 mg total) by mouth 2 (two) times daily as needed for muscle spasms. 30 tablet 1   losartan-hydrochlorothiazide (HYZAAR) 50-12.5 MG tablet Take 1 tablet  by mouth daily. 90 tablet 1   naproxen (NAPROSYN) 500 MG tablet Take 1 tablet (500 mg total) by mouth 2 (two) times daily with a meal. 60 tablet 5   sildenafil (VIAGRA) 50 MG tablet TAKE 1 TABLET BY MOUTH DAILY AS NEEDED FOR ERECTILE DYSFUNCTION (MAX OF 6 TABS EVERY 25 DAYS) 6 tablet 1   triamcinolone cream (KENALOG) 0.1 % Apply 1 Application topically 2 (two) times daily. 30 g 0   gabapentin (NEURONTIN) 300 MG capsule Take 1 capsule (300 mg total) by mouth 2 (two) times daily. 60 capsule 5   No facility-administered medications prior to visit.    No Known Allergies  ROS Review of Systems  Constitutional:  Negative for chills and fever.   HENT:  Negative for congestion and sore throat.   Eyes:  Negative for pain and discharge.  Respiratory:  Negative for cough and shortness of breath.   Cardiovascular:  Negative for chest pain and palpitations.  Gastrointestinal:  Negative for constipation, diarrhea, nausea and vomiting.  Endocrine: Negative for polydipsia and polyuria.  Genitourinary:  Negative for dysuria and hematuria.  Musculoskeletal:  Positive for arthralgias (B/l wrist and thumb) and back pain. Negative for neck pain and neck stiffness.  Skin:  Positive for rash.  Neurological:  Negative for dizziness, weakness, numbness and headaches.  Psychiatric/Behavioral:  Negative for agitation and behavioral problems.       Objective:    Physical Exam Vitals reviewed.  Constitutional:      General: He is not in acute distress.    Appearance: He is not diaphoretic.  HENT:     Head: Normocephalic and atraumatic.     Nose: Nose normal.     Mouth/Throat:     Mouth: Mucous membranes are moist.  Eyes:     General: No scleral icterus.    Extraocular Movements: Extraocular movements intact.  Cardiovascular:     Rate and Rhythm: Normal rate and regular rhythm.     Pulses: Normal pulses.     Heart sounds: Normal heart sounds. No murmur heard. Pulmonary:     Breath sounds: Normal breath sounds. No wheezing or rales.  Musculoskeletal:        General: Tenderness (Lumbar spine area) present.     Cervical back: Neck supple. No tenderness.     Right lower leg: No edema.     Left lower leg: No edema.  Skin:    General: Skin is warm.     Findings: Rash (Thickening of skin over b/l hands - dorsal aspect and forearms - eczematous rash) present.  Neurological:     General: No focal deficit present.     Mental Status: He is alert and oriented to person, place, and time.  Psychiatric:        Mood and Affect: Mood normal.        Behavior: Behavior normal.     BP (!) 142/88 (BP Location: Left Arm)   Pulse 75   Ht 6' (1.829  m)   Wt 208 lb 9.6 oz (94.6 kg)   SpO2 98%   BMI 28.29 kg/m  Wt Readings from Last 3 Encounters:  01/12/23 208 lb 9.6 oz (94.6 kg)  08/06/22 211 lb 6.4 oz (95.9 kg)  02/04/22 215 lb (97.5 kg)    Lab Results  Component Value Date   TSH 3.230 01/09/2023   Lab Results  Component Value Date   WBC 5.3 01/09/2023   HGB 12.9 (L) 01/09/2023   HCT 39.4 01/09/2023  MCV 96 01/09/2023   PLT 257 01/09/2023   Lab Results  Component Value Date   NA 141 01/09/2023   K 3.9 01/09/2023   CO2 23 01/09/2023   GLUCOSE 132 (H) 01/09/2023   BUN 14 01/09/2023   CREATININE 0.92 01/09/2023   BILITOT 0.3 01/09/2023   ALKPHOS 60 01/09/2023   AST 28 01/09/2023   ALT 30 01/09/2023   PROT 7.0 01/09/2023   ALBUMIN 4.3 01/09/2023   CALCIUM 9.0 01/09/2023   EGFR 97 01/09/2023   Lab Results  Component Value Date   CHOL 196 01/09/2023   Lab Results  Component Value Date   HDL 73 01/09/2023   Lab Results  Component Value Date   LDLCALC 109 (H) 01/09/2023   Lab Results  Component Value Date   TRIG 79 01/09/2023   Lab Results  Component Value Date   CHOLHDL 2.7 01/09/2023   Lab Results  Component Value Date   HGBA1C 6.3 (H) 01/09/2023      Assessment & Plan:   Problem List Items Addressed This Visit       Cardiovascular and Mediastinum   Essential hypertension - Primary    BP Readings from Last 1 Encounters:  01/12/23 (!) 142/88  Elevated today as he has not had his medicine yet Well-controlled with Losartan-HCTZ Counseled for compliance with the medications Advised DASH diet and moderate exercise/walking, at least 150 mins/week        Other   Spinal stenosis of lumbar region    On Gabapentin to 300 mg BID for now Increased nightly dose to 600 mg Advised to avoid heavy lifting and frequent bending - advised to bring paperwork/forms from workplace for light duty work      Relevant Medications   gabapentin (NEURONTIN) 300 MG capsule   Mixed hyperlipidemia    Advised  to follow low-cholesterol diet      Prediabetes    Lab Results  Component Value Date   HGBA1C 6.3 (H) 01/09/2023  Advised to follow low carb, low cholesterol diet      Vitamin D deficiency    Advised to take vitamin D 5000 IU daily      Other Visit Diagnoses     Colon cancer screening       Relevant Orders   Ambulatory referral to Gastroenterology   Need for shingles vaccine       Relevant Orders   Varicella-zoster vaccine IM (Completed)      Meds ordered this encounter  Medications   gabapentin (NEURONTIN) 300 MG capsule    Sig: Take 1 capsule (300 mg total) by mouth in the morning AND 2 capsules (600 mg total) at bedtime.    Dispense:  90 capsule    Refill:  3    DOSE CHANGE - 01/12/23    Follow-up: Return in about 2 months (around 03/14/2023) for HTN and back pain.    Anabel Halon, MD

## 2023-01-12 NOTE — Patient Instructions (Addendum)
Please start taking Gabapentin 2 capsules at bedtime and continue taking 1 capsule in the morning.  Please avoid heavy lifting and frequent bending.  Please start taking Vitamin D 5000 IU once daily.  Please continue to take other medications as prescribed.  Please continue to follow low carb diet and perform moderate exercise/walking at least 150 mins/week.

## 2023-01-12 NOTE — Assessment & Plan Note (Addendum)
On Gabapentin to 300 mg BID for now Increased nightly dose to 600 mg Advised to avoid heavy lifting and frequent bending - advised to bring paperwork/forms from workplace for light duty work

## 2023-01-13 ENCOUNTER — Encounter (INDEPENDENT_AMBULATORY_CARE_PROVIDER_SITE_OTHER): Payer: Self-pay | Admitting: *Deleted

## 2023-01-16 NOTE — Assessment & Plan Note (Signed)
Advised to take vitamin D 5000 IU daily 

## 2023-01-16 NOTE — Assessment & Plan Note (Signed)
Advised to follow low cholesterol diet 

## 2023-01-16 NOTE — Assessment & Plan Note (Signed)
BP Readings from Last 1 Encounters:  01/12/23 (!) 142/88   Elevated today as he has not had his medicine yet Well-controlled with Losartan-HCTZ Counseled for compliance with the medications Advised DASH diet and moderate exercise/walking, at least 150 mins/week

## 2023-01-16 NOTE — Assessment & Plan Note (Signed)
Lab Results  Component Value Date   HGBA1C 6.3 (H) 01/09/2023   Advised to follow low carb, low cholesterol diet

## 2023-02-10 ENCOUNTER — Encounter: Payer: BC Managed Care – PPO | Admitting: Orthopaedic Surgery

## 2023-02-10 ENCOUNTER — Telehealth: Payer: Self-pay | Admitting: Internal Medicine

## 2023-02-10 ENCOUNTER — Other Ambulatory Visit: Payer: Self-pay | Admitting: Internal Medicine

## 2023-02-10 DIAGNOSIS — M48062 Spinal stenosis, lumbar region with neurogenic claudication: Secondary | ICD-10-CM

## 2023-02-10 MED ORDER — CYCLOBENZAPRINE HCL 5 MG PO TABS
5.0000 mg | ORAL_TABLET | Freq: Two times a day (BID) | ORAL | 1 refills | Status: DC | PRN
Start: 2023-02-10 — End: 2023-03-09

## 2023-02-10 NOTE — Telephone Encounter (Signed)
Patient called after call hours left voicemail, [facility] received a fax need refill on medication  Patient asked for refill on his muscle spasms medication and Cyclobenzaprine refills  Patient call back # [phone]

## 2023-02-10 NOTE — Telephone Encounter (Signed)
Left message for patient

## 2023-03-09 ENCOUNTER — Telehealth: Payer: Self-pay | Admitting: Internal Medicine

## 2023-03-09 ENCOUNTER — Other Ambulatory Visit: Payer: Self-pay

## 2023-03-09 DIAGNOSIS — M48062 Spinal stenosis, lumbar region with neurogenic claudication: Secondary | ICD-10-CM

## 2023-03-09 DIAGNOSIS — I10 Essential (primary) hypertension: Secondary | ICD-10-CM

## 2023-03-09 DIAGNOSIS — N529 Male erectile dysfunction, unspecified: Secondary | ICD-10-CM

## 2023-03-09 DIAGNOSIS — L239 Allergic contact dermatitis, unspecified cause: Secondary | ICD-10-CM

## 2023-03-09 MED ORDER — CYCLOBENZAPRINE HCL 5 MG PO TABS
5.0000 mg | ORAL_TABLET | Freq: Two times a day (BID) | ORAL | 1 refills | Status: DC | PRN
Start: 2023-03-09 — End: 2023-04-10

## 2023-03-09 MED ORDER — SILDENAFIL CITRATE 50 MG PO TABS
50.0000 mg | ORAL_TABLET | ORAL | 1 refills | Status: DC | PRN
Start: 2023-03-09 — End: 2023-06-23

## 2023-03-09 MED ORDER — GABAPENTIN 300 MG PO CAPS: ORAL_CAPSULE | ORAL | 3 refills | Status: DC

## 2023-03-09 MED ORDER — TRIAMCINOLONE ACETONIDE 0.1 % EX CREA
1.0000 | TOPICAL_CREAM | Freq: Two times a day (BID) | CUTANEOUS | 0 refills | Status: DC
Start: 2023-03-09 — End: 2023-06-23

## 2023-03-09 MED ORDER — LOSARTAN POTASSIUM-HCTZ 50-12.5 MG PO TABS: 1.0000 | ORAL_TABLET | Freq: Every day | ORAL | 1 refills | Status: DC

## 2023-03-09 NOTE — Telephone Encounter (Signed)
Refills sent

## 2023-03-09 NOTE — Telephone Encounter (Signed)
Prescription Request  03/09/2023  LOV: 01/12/2023  What is the name of the medication or equipment? cyclobenzaprine (FLEXERIL) 5 MG tablet [[id number]]  gabapentin (NEURONTIN) 300 MG capsule [[id number]]  losartan-hydrochlorothiazide (HYZAAR) 50-12.5 MG tablet [[id number]]  sildenafil (VIAGRA) 50 MG tablet [[id number]]  triamcinolone cream (KENALOG) 0.1 % [[id number]]  Have you contacted your pharmacy to request a refill? No   Which pharmacy would you like this sent to?   [id number] [place], [place] - [address] AT [facility] [address] [place] [address] Phone: [phone] Fax: [phone]    Patient notified that their request is being sent to the clinical staff for review and that they should receive a response within 2 business days.   Please advise at Mobile [phone] (mobile)

## 2023-03-13 ENCOUNTER — Telehealth: Payer: Self-pay | Admitting: Internal Medicine

## 2023-03-13 NOTE — Telephone Encounter (Signed)
Spoke to patient , refills sent to his pharmacy

## 2023-03-13 NOTE — Telephone Encounter (Signed)
Patient called left a voicemail need his pain medication sent into his pharmacy

## 2023-03-20 ENCOUNTER — Ambulatory Visit: Payer: BC Managed Care – PPO | Admitting: Internal Medicine

## 2023-04-10 ENCOUNTER — Encounter: Payer: Self-pay | Admitting: Internal Medicine

## 2023-04-10 ENCOUNTER — Ambulatory Visit: Payer: BC Managed Care – PPO | Admitting: Internal Medicine

## 2023-04-10 VITALS — BP 139/87 | HR 66 | Ht 72.0 in | Wt 203.1 lb

## 2023-04-10 DIAGNOSIS — Z1211 Encounter for screening for malignant neoplasm of colon: Secondary | ICD-10-CM | POA: Diagnosis not present

## 2023-04-10 DIAGNOSIS — L239 Allergic contact dermatitis, unspecified cause: Secondary | ICD-10-CM

## 2023-04-10 DIAGNOSIS — Z23 Encounter for immunization: Secondary | ICD-10-CM

## 2023-04-10 DIAGNOSIS — I10 Essential (primary) hypertension: Secondary | ICD-10-CM | POA: Diagnosis not present

## 2023-04-10 DIAGNOSIS — M48062 Spinal stenosis, lumbar region with neurogenic claudication: Secondary | ICD-10-CM | POA: Diagnosis not present

## 2023-04-10 DIAGNOSIS — N529 Male erectile dysfunction, unspecified: Secondary | ICD-10-CM

## 2023-04-10 MED ORDER — CYCLOBENZAPRINE HCL 5 MG PO TABS
5.0000 mg | ORAL_TABLET | Freq: Two times a day (BID) | ORAL | 3 refills | Status: DC | PRN
Start: 2023-04-10 — End: 2023-06-04

## 2023-04-10 NOTE — Assessment & Plan Note (Signed)
Discussed about colonoscopy and cologuard - benefits of each procedure discussed. Patient prefers Cologuard - ordered.

## 2023-04-10 NOTE — Assessment & Plan Note (Addendum)
BP Readings from Last 1 Encounters:  04/10/23 139/87   Well-controlled with Losartan-HCTZ Counseled for compliance with the medications Advised DASH diet and moderate exercise/walking, at least 150 mins/week

## 2023-04-10 NOTE — Assessment & Plan Note (Addendum)
On Gabapentin to 300 mg QAM and 600 mg QHS Advised to avoid heavy lifting and frequent bending - advised to bring paperwork/forms from workplace for light duty work Flexeril as needed for muscle spasms Needs spine surgery evaluation-but he prefers to avoid surgery, but  agrees to physiatry referral for discussion of epidural injection

## 2023-04-10 NOTE — Assessment & Plan Note (Signed)
Has Viagra PRN

## 2023-04-10 NOTE — Assessment & Plan Note (Signed)
Has darkening of skin over hands and forearms Kenalog cream

## 2023-04-10 NOTE — Patient Instructions (Signed)
You are being referred to Dr Ronne Binning.  [address] [place], [place] [address] [phone]  Please continue to take medications as prescribed.  Please continue to follow DASH diet and perform moderate exercise/walking at least 150 mins/week.

## 2023-04-10 NOTE — Progress Notes (Signed)
Established Patient Office Visit  Subjective:  Patient ID: Gregory Marsh, male    DOB: [birth year]  Age: 57 y.o. MRN: [id number]  CC:  Chief Complaint  Patient presents with   Follow-up    Follow up bp and back pain     HPI Gregory Marsh is a 57 y.o. male with past medical history of HTN and lumbar spinal stenosis who presents for f/u of his chronic medical conditions.  HTN: BP is well-controlled. Takes medications regularly. Patient denies headache, dizziness, chest pain, dyspnea or palpitations.   He continues to have chronic low back pain, which radiates to his left LE. He admits that he has been lifting heavy weights at work and has to bend multiple times to move heavy objects, which has worsened his pain recently.  He has history of lumbar spinal stenosis. He takes Gabapentin 300 mg QAM and 600 mg QPM for it. He denies any saddle anesthesia, urinary or stool incontinence currently.   He c/o erectile dysfunction. Denies dysuria, hematuria or urinary hesitancy.   Past Medical History:  Diagnosis Date   AC separation, type 3, left, sequela 12/10/2011   Articular cartilage disorder, shoulder region 01/13/2012   Primary localized osteoarthrosis, shoulder region 01/13/2012    Past Surgical History:  Procedure Laterality Date   SHOULDER ARTHROSCOPY  01/09/2012   Procedure: ARTHROSCOPY SHOULDER;  Surgeon: Vickki Hearing, MD;  Location: [facility];  Service: Orthopedics;  Laterality: Left;  with debridement   SHOULDER OPEN ROTATOR CUFF REPAIR  01/09/2012   Procedure: ROTATOR CUFF REPAIR SHOULDER OPEN;  Surgeon: Vickki Hearing, MD;  Location: [facility];  Service: Orthopedics;  Laterality: Left;  with distal clavicle excision    Family History  Problem Relation Age of Onset   Diabetes Other    Diabetes Mother    Anesthesia problems Neg Hx    Hypotension Neg Hx    Malignant hyperthermia Neg Hx    Pseudochol deficiency Neg Hx     Social History   Socioeconomic History   Marital  status: Married    Spouse name: Not on file   Number of children: Not on file   Years of education: 12   Highest education level: Not on file  Occupational History    Employer: [employer]  Tobacco Use   Smoking status: Light Smoker    Types: Cigars   Smokeless tobacco: Never  Vaping Use   Vaping status: Never Used  Substance and Sexual Activity   Alcohol use: Yes    Alcohol/week: 6.0 standard drinks of alcohol    Types: 6 Cans of beer per week    Comment: occasionally   Drug use: Yes    Types: Marijuana   Sexual activity: Yes    Birth control/protection: None  Other Topics Concern   Not on file  Social History Narrative   Not on file   Social Determinants of Health   Financial Resource Strain: Not on file  Food Insecurity: Not on file  Transportation Needs: Not on file  Physical Activity: Not on file  Stress: Not on file  Social Connections: Not on file  Intimate Partner Violence: Not on file    Outpatient Medications Prior to Visit  Medication Sig Dispense Refill   gabapentin (NEURONTIN) 300 MG capsule Take 1 capsule (300 mg total) by mouth in the morning AND 2 capsules (600 mg total) at bedtime. 90 capsule 3   losartan-hydrochlorothiazide (HYZAAR) 50-12.5 MG tablet Take 1 tablet by mouth daily. 90  tablet 1   naproxen (NAPROSYN) 500 MG tablet Take 1 tablet (500 mg total) by mouth 2 (two) times daily with a meal. 60 tablet 5   sildenafil (VIAGRA) 50 MG tablet Take 1 tablet (50 mg total) by mouth as needed for erectile dysfunction. 6 tablet 1   triamcinolone cream (KENALOG) 0.1 % Apply 1 Application topically 2 (two) times daily. 30 g 0   cyclobenzaprine (FLEXERIL) 5 MG tablet Take 1 tablet (5 mg total) by mouth 2 (two) times daily as needed for muscle spasms. 30 tablet 1   No facility-administered medications prior to visit.    No Known Allergies  ROS Review of Systems  Constitutional:  Negative for chills and fever.  HENT:  Negative for congestion and sore  throat.   Eyes:  Negative for pain and discharge.  Respiratory:  Negative for cough and shortness of breath.   Cardiovascular:  Negative for chest pain and palpitations.  Gastrointestinal:  Negative for constipation, diarrhea, nausea and vomiting.  Endocrine: Negative for polydipsia and polyuria.  Genitourinary:  Negative for dysuria and hematuria.  Musculoskeletal:  Positive for arthralgias (B/l wrist and thumb) and back pain. Negative for neck pain and neck stiffness.  Skin:  Positive for rash.  Neurological:  Negative for dizziness, weakness, numbness and headaches.  Psychiatric/Behavioral:  Negative for agitation and behavioral problems.       Objective:    Physical Exam Vitals reviewed.  Constitutional:      General: He is not in acute distress.    Appearance: He is not diaphoretic.  HENT:     Head: Normocephalic and atraumatic.     Nose: Nose normal.     Mouth/Throat:     Mouth: Mucous membranes are moist.  Eyes:     General: No scleral icterus.    Extraocular Movements: Extraocular movements intact.  Cardiovascular:     Rate and Rhythm: Normal rate and regular rhythm.     Pulses: Normal pulses.     Heart sounds: Normal heart sounds. No murmur heard. Pulmonary:     Breath sounds: Normal breath sounds. No wheezing or rales.  Musculoskeletal:        General: Tenderness (Lumbar spine area) present.     Cervical back: Neck supple. No tenderness.     Right lower leg: No edema.     Left lower leg: No edema.  Skin:    General: Skin is warm.     Findings: Rash (Thickening of skin over b/l hands - dorsal aspect and forearms - eczematous rash) present.  Neurological:     General: No focal deficit present.     Mental Status: He is alert and oriented to person, place, and time.  Psychiatric:        Mood and Affect: Mood normal.        Behavior: Behavior normal.     BP 139/87 (BP Location: Right Arm, Patient Position: Sitting, Cuff Size: Large)   Pulse 66   Ht 6' (1.829  m)   Wt 203 lb 1.9 oz (92.1 kg)   SpO2 97%   BMI 27.55 kg/m  Wt Readings from Last 3 Encounters:  04/10/23 203 lb 1.9 oz (92.1 kg)  01/12/23 208 lb 9.6 oz (94.6 kg)  08/06/22 211 lb 6.4 oz (95.9 kg)    Lab Results  Component Value Date   TSH 3.230 01/09/2023   Lab Results  Component Value Date   WBC 5.3 01/09/2023   HGB 12.9 (L) 01/09/2023   HCT 39.4  01/09/2023   MCV 96 01/09/2023   PLT 257 01/09/2023   Lab Results  Component Value Date   NA 141 01/09/2023   K 3.9 01/09/2023   CO2 23 01/09/2023   GLUCOSE 132 (H) 01/09/2023   BUN 14 01/09/2023   CREATININE 0.92 01/09/2023   BILITOT 0.3 01/09/2023   ALKPHOS 60 01/09/2023   AST 28 01/09/2023   ALT 30 01/09/2023   PROT 7.0 01/09/2023   ALBUMIN 4.3 01/09/2023   CALCIUM 9.0 01/09/2023   EGFR 97 01/09/2023   Lab Results  Component Value Date   CHOL 196 01/09/2023   Lab Results  Component Value Date   HDL 73 01/09/2023   Lab Results  Component Value Date   LDLCALC 109 (H) 01/09/2023   Lab Results  Component Value Date   TRIG 79 01/09/2023   Lab Results  Component Value Date   CHOLHDL 2.7 01/09/2023   Lab Results  Component Value Date   HGBA1C 6.3 (H) 01/09/2023      Assessment & Plan:   Problem List Items Addressed This Visit       Cardiovascular and Mediastinum   Essential hypertension - Primary    BP Readings from Last 1 Encounters:  04/10/23 139/87   Well-controlled with Losartan-HCTZ Counseled for compliance with the medications Advised DASH diet and moderate exercise/walking, at least 150 mins/week        Musculoskeletal and Integument   Allergic contact dermatitis    Has darkening of skin over hands and forearms Kenalog cream        Other   Spinal stenosis of lumbar region    On Gabapentin to 300 mg QAM and 600 mg QHS Advised to avoid heavy lifting and frequent bending - advised to bring paperwork/forms from workplace for light duty work Flexeril as needed for muscle  spasms Needs spine surgery evaluation-but he prefers to avoid surgery, but  agrees to physiatry referral for discussion of epidural injection      Relevant Medications   cyclobenzaprine (FLEXERIL) 5 MG tablet   Other Relevant Orders   Ambulatory referral to Orthopedic Surgery   Erectile dysfunction    Has Viagra PRN      Colon cancer screening    Discussed about colonoscopy and cologuard - benefits of each procedure discussed. Patient prefers Cologuard - ordered.      Relevant Orders   Cologuard   Other Visit Diagnoses     Encounter for immunization       Relevant Orders   Flu vaccine trivalent PF, 6mos and older(Flulaval,Afluria,Fluarix,Fluzone) (Completed)       Meds ordered this encounter  Medications   cyclobenzaprine (FLEXERIL) 5 MG tablet    Sig: Take 1 tablet (5 mg total) by mouth 2 (two) times daily as needed for muscle spasms.    Dispense:  30 tablet    Refill:  3    Follow-up: Return in about 4 months (around 08/10/2023) for Annual physical.    Anabel Halon, MD

## 2023-04-28 DIAGNOSIS — Z1211 Encounter for screening for malignant neoplasm of colon: Secondary | ICD-10-CM | POA: Diagnosis not present

## 2023-05-02 LAB — COLOGUARD: COLOGUARD: POSITIVE — AB

## 2023-05-04 ENCOUNTER — Other Ambulatory Visit: Payer: Self-pay

## 2023-05-04 DIAGNOSIS — R195 Other fecal abnormalities: Secondary | ICD-10-CM

## 2023-05-06 ENCOUNTER — Other Ambulatory Visit: Payer: Self-pay | Admitting: *Deleted

## 2023-05-06 ENCOUNTER — Encounter: Payer: Self-pay | Admitting: *Deleted

## 2023-05-06 ENCOUNTER — Encounter: Payer: Self-pay | Admitting: Gastroenterology

## 2023-05-06 ENCOUNTER — Ambulatory Visit (INDEPENDENT_AMBULATORY_CARE_PROVIDER_SITE_OTHER): Payer: BC Managed Care – PPO | Admitting: Gastroenterology

## 2023-05-06 VITALS — BP 128/84 | HR 72 | Temp 97.9°F | Ht 72.0 in | Wt 204.4 lb

## 2023-05-06 DIAGNOSIS — R195 Other fecal abnormalities: Secondary | ICD-10-CM | POA: Insufficient documentation

## 2023-05-06 MED ORDER — PEG 3350-KCL-NA BICARB-NACL 420 G PO SOLR: 4000.0000 mL | Freq: Once | ORAL | 0 refills | Status: AC

## 2023-05-06 NOTE — Progress Notes (Signed)
GI Office Note    Referring Provider: Anabel Halon, MD Primary Care Physician:  Anabel Halon, MD  Primary Gastroenterologist: Roetta Sessions, MD   Chief Complaint   Chief Complaint  Patient presents with   Colonoscopy    Positive cologuard     History of Present Illness   Gregory Marsh is a 57 y.o. male presenting today at the request of Dr. Allena Katz for positive Cologuard.  No constipation, diarrhea. No melena, brbpr. Has hemorrhoids flare at times, uses Prep H. No abdominal pain. No heartburn, dysphagia, n/v. No weight loss.   No prior colonoscopy.  Medications   Current Outpatient Medications  Medication Sig Dispense Refill   cyclobenzaprine (FLEXERIL) 5 MG tablet Take 1 tablet (5 mg total) by mouth 2 (two) times daily as needed for muscle spasms. 30 tablet 3   gabapentin (NEURONTIN) 300 MG capsule Take 1 capsule (300 mg total) by mouth in the morning AND 2 capsules (600 mg total) at bedtime. 90 capsule 3   losartan-hydrochlorothiazide (HYZAAR) 50-12.5 MG tablet Take 1 tablet by mouth daily. 90 tablet 1   naproxen (NAPROSYN) 500 MG tablet Take 1 tablet (500 mg total) by mouth 2 (two) times daily with a meal. 60 tablet 5   sildenafil (VIAGRA) 50 MG tablet Take 1 tablet (50 mg total) by mouth as needed for erectile dysfunction. 6 tablet 1   triamcinolone cream (KENALOG) 0.1 % Apply 1 Application topically 2 (two) times daily. 30 g 0   No current facility-administered medications for this visit.    Allergies   Allergies as of 05/06/2023   (No Known Allergies)    Past Medical History   Past Medical History:  Diagnosis Date   AC separation, type 3, left, sequela 12/10/2011   Articular cartilage disorder, shoulder region 01/13/2012   Back pain    HTN (hypertension)    Primary localized osteoarthrosis, shoulder region 01/13/2012    Past Surgical History   Past Surgical History:  Procedure Laterality Date   SHOULDER ARTHROSCOPY  01/09/2012   Procedure:  ARTHROSCOPY SHOULDER;  Surgeon: Vickki Hearing, MD;  Location: [facility];  Service: Orthopedics;  Laterality: Left;  with debridement   SHOULDER OPEN ROTATOR CUFF REPAIR  01/09/2012   Procedure: ROTATOR CUFF REPAIR SHOULDER OPEN;  Surgeon: Vickki Hearing, MD;  Location: [facility];  Service: Orthopedics;  Laterality: Left;  with distal clavicle excision    Past Family History   Family History  Problem Relation Age of Onset   Diabetes Mother    Diabetes Other    Anesthesia problems Neg Hx    Hypotension Neg Hx    Malignant hyperthermia Neg Hx    Pseudochol deficiency Neg Hx    Colon cancer Neg Hx     Past Social History   Social History   Socioeconomic History   Marital status: Married    Spouse name: Not on file   Number of children: Not on file   Years of education: 12   Highest education level: Not on file  Occupational History    Employer: [employer]  Tobacco Use   Smoking status: Light Smoker    Types: Cigars   Smokeless tobacco: Never  Vaping Use   Vaping status: Never Used  Substance and Sexual Activity   Alcohol use: Yes    Alcohol/week: 6.0 standard drinks of alcohol    Types: 6 Cans of beer per week    Comment: occasionally   Drug use: Yes  Types: Marijuana   Sexual activity: Yes    Birth control/protection: None  Other Topics Concern   Not on file  Social History Narrative   Not on file   Social Determinants of Health   Financial Resource Strain: Not on file  Food Insecurity: Not on file  Transportation Needs: Not on file  Physical Activity: Not on file  Stress: Not on file  Social Connections: Not on file  Intimate Partner Violence: Not on file    Review of Systems   General: Negative for anorexia, weight loss, fever, chills, fatigue, weakness. Eyes: Negative for vision changes.  ENT: Negative for hoarseness, difficulty swallowing , nasal congestion. CV: Negative for chest pain, angina, palpitations, dyspnea on exertion, peripheral  edema.  Respiratory: Negative for dyspnea at rest, dyspnea on exertion, cough, sputum, wheezing.  GI: See history of present illness. GU:  Negative for dysuria, hematuria, urinary incontinence, urinary frequency, nocturnal urination.  MS: Negative for joint pain. + back pain.  Derm: Negative for rash or itching.  Neuro: Negative for weakness, abnormal sensation, seizure, frequent headaches, memory loss,  confusion.  Psych: Negative for anxiety, depression, suicidal ideation, hallucinations.  Endo: Negative for unusual weight change.  Heme: Negative for bruising or bleeding. Allergy: Negative for rash or hives.  Physical Exam   BP 128/84 (BP Location: Right Arm, Patient Position: Sitting, Cuff Size: Normal)   Pulse 72   Temp 97.9 F (36.6 C) (Oral)   Ht 6' (1.829 m)   Wt 204 lb 6.4 oz (92.7 kg)   SpO2 99%   BMI 27.72 kg/m    General: Well-nourished, well-developed in no acute distress.  Head: Normocephalic, atraumatic.   Eyes: Conjunctiva pink, no icterus. Mouth: Oropharyngeal mucosa moist and pink   Neck: Supple without thyromegaly, masses, or lymphadenopathy.  Lungs: Clear to auscultation bilaterally.  Heart: Regular rate and rhythm, no murmurs rubs or gallops.  Abdomen: Bowel sounds are normal, nontender, nondistended, no hepatosplenomegaly or masses,  no abdominal bruits or hernia, no rebound or guarding.   Rectal: not performed Extremities: No lower extremity edema. No clubbing or deformities.  Neuro: Alert and oriented x 4 , grossly normal neurologically.  Skin: Warm and dry, no rash or jaundice.   Psych: Alert and cooperative, normal mood and affect.  Labs   Lab Results  Component Value Date   NA 141 01/09/2023   CL 102 01/09/2023   K 3.9 01/09/2023   CO2 23 01/09/2023   BUN 14 01/09/2023   CREATININE 0.92 01/09/2023   EGFR 97 01/09/2023   CALCIUM 9.0 01/09/2023   ALBUMIN 4.3 01/09/2023   GLUCOSE 132 (H) 01/09/2023   Lab Results  Component Value Date    ALT 30 01/09/2023   AST 28 01/09/2023   ALKPHOS 60 01/09/2023   BILITOT 0.3 01/09/2023   Lab Results  Component Value Date   WBC 5.3 01/09/2023   HGB 12.9 (L) 01/09/2023   HCT 39.4 01/09/2023   MCV 96 01/09/2023   PLT 257 01/09/2023    Imaging Studies   No results found.  Assessment   *Positive Cologuard   PLAN   Colonoscopy. ASA 2.  I have discussed the risks, alternatives, benefits with regards to but not limited to the risk of reaction to medication, bleeding, infection, perforation and the patient is agreeable to proceed. Written consent to be obtained.    Leanna Battles. Melvyn Neth, MHS, PA-C [facility]

## 2023-05-06 NOTE — H&P (View-Only) (Signed)
GI Office Note    Referring Provider: Anabel Halon, MD Primary Care Physician:  Anabel Halon, MD  Primary Gastroenterologist: Roetta Sessions, MD   Chief Complaint   Chief Complaint  Patient presents with   Colonoscopy    Positive cologuard     History of Present Illness   Gregory Marsh is a 57 y.o. male presenting today at the request of Dr. Allena Katz for positive Cologuard.  No constipation, diarrhea. No melena, brbpr. Has hemorrhoids flare at times, uses Prep H. No abdominal pain. No heartburn, dysphagia, n/v. No weight loss.   No prior colonoscopy.  Medications   Current Outpatient Medications  Medication Sig Dispense Refill   cyclobenzaprine (FLEXERIL) 5 MG tablet Take 1 tablet (5 mg total) by mouth 2 (two) times daily as needed for muscle spasms. 30 tablet 3   gabapentin (NEURONTIN) 300 MG capsule Take 1 capsule (300 mg total) by mouth in the morning AND 2 capsules (600 mg total) at bedtime. 90 capsule 3   losartan-hydrochlorothiazide (HYZAAR) 50-12.5 MG tablet Take 1 tablet by mouth daily. 90 tablet 1   naproxen (NAPROSYN) 500 MG tablet Take 1 tablet (500 mg total) by mouth 2 (two) times daily with a meal. 60 tablet 5   sildenafil (VIAGRA) 50 MG tablet Take 1 tablet (50 mg total) by mouth as needed for erectile dysfunction. 6 tablet 1   triamcinolone cream (KENALOG) 0.1 % Apply 1 Application topically 2 (two) times daily. 30 g 0   No current facility-administered medications for this visit.    Allergies   Allergies as of 05/06/2023   (No Known Allergies)    Past Medical History   Past Medical History:  Diagnosis Date   AC separation, type 3, left, sequela 12/10/2011   Articular cartilage disorder, shoulder region 01/13/2012   Back pain    HTN (hypertension)    Primary localized osteoarthrosis, shoulder region 01/13/2012    Past Surgical History   Past Surgical History:  Procedure Laterality Date   SHOULDER ARTHROSCOPY  01/09/2012   Procedure:  ARTHROSCOPY SHOULDER;  Surgeon: Vickki Hearing, MD;  Location: [facility];  Service: Orthopedics;  Laterality: Left;  with debridement   SHOULDER OPEN ROTATOR CUFF REPAIR  01/09/2012   Procedure: ROTATOR CUFF REPAIR SHOULDER OPEN;  Surgeon: Vickki Hearing, MD;  Location: [facility];  Service: Orthopedics;  Laterality: Left;  with distal clavicle excision    Past Family History   Family History  Problem Relation Age of Onset   Diabetes Mother    Diabetes Other    Anesthesia problems Neg Hx    Hypotension Neg Hx    Malignant hyperthermia Neg Hx    Pseudochol deficiency Neg Hx    Colon cancer Neg Hx     Past Social History   Social History   Socioeconomic History   Marital status: Married    Spouse name: Not on file   Number of children: Not on file   Years of education: 12   Highest education level: Not on file  Occupational History    Employer: [employer]  Tobacco Use   Smoking status: Light Smoker    Types: Cigars   Smokeless tobacco: Never  Vaping Use   Vaping status: Never Used  Substance and Sexual Activity   Alcohol use: Yes    Alcohol/week: 6.0 standard drinks of alcohol    Types: 6 Cans of beer per week    Comment: occasionally   Drug use: Yes  Types: Marijuana   Sexual activity: Yes    Birth control/protection: None  Other Topics Concern   Not on file  Social History Narrative   Not on file   Social Determinants of Health   Financial Resource Strain: Not on file  Food Insecurity: Not on file  Transportation Needs: Not on file  Physical Activity: Not on file  Stress: Not on file  Social Connections: Not on file  Intimate Partner Violence: Not on file    Review of Systems   General: Negative for anorexia, weight loss, fever, chills, fatigue, weakness. Eyes: Negative for vision changes.  ENT: Negative for hoarseness, difficulty swallowing , nasal congestion. CV: Negative for chest pain, angina, palpitations, dyspnea on exertion, peripheral  edema.  Respiratory: Negative for dyspnea at rest, dyspnea on exertion, cough, sputum, wheezing.  GI: See history of present illness. GU:  Negative for dysuria, hematuria, urinary incontinence, urinary frequency, nocturnal urination.  MS: Negative for joint pain. + back pain.  Derm: Negative for rash or itching.  Neuro: Negative for weakness, abnormal sensation, seizure, frequent headaches, memory loss,  confusion.  Psych: Negative for anxiety, depression, suicidal ideation, hallucinations.  Endo: Negative for unusual weight change.  Heme: Negative for bruising or bleeding. Allergy: Negative for rash or hives.  Physical Exam   BP 128/84 (BP Location: Right Arm, Patient Position: Sitting, Cuff Size: Normal)   Pulse 72   Temp 97.9 F (36.6 C) (Oral)   Ht 6' (1.829 m)   Wt 204 lb 6.4 oz (92.7 kg)   SpO2 99%   BMI 27.72 kg/m    General: Well-nourished, well-developed in no acute distress.  Head: Normocephalic, atraumatic.   Eyes: Conjunctiva pink, no icterus. Mouth: Oropharyngeal mucosa moist and pink   Neck: Supple without thyromegaly, masses, or lymphadenopathy.  Lungs: Clear to auscultation bilaterally.  Heart: Regular rate and rhythm, no murmurs rubs or gallops.  Abdomen: Bowel sounds are normal, nontender, nondistended, no hepatosplenomegaly or masses,  no abdominal bruits or hernia, no rebound or guarding.   Rectal: not performed Extremities: No lower extremity edema. No clubbing or deformities.  Neuro: Alert and oriented x 4 , grossly normal neurologically.  Skin: Warm and dry, no rash or jaundice.   Psych: Alert and cooperative, normal mood and affect.  Labs   Lab Results  Component Value Date   NA 141 01/09/2023   CL 102 01/09/2023   K 3.9 01/09/2023   CO2 23 01/09/2023   BUN 14 01/09/2023   CREATININE 0.92 01/09/2023   EGFR 97 01/09/2023   CALCIUM 9.0 01/09/2023   ALBUMIN 4.3 01/09/2023   GLUCOSE 132 (H) 01/09/2023   Lab Results  Component Value Date    ALT 30 01/09/2023   AST 28 01/09/2023   ALKPHOS 60 01/09/2023   BILITOT 0.3 01/09/2023   Lab Results  Component Value Date   WBC 5.3 01/09/2023   HGB 12.9 (L) 01/09/2023   HCT 39.4 01/09/2023   MCV 96 01/09/2023   PLT 257 01/09/2023    Imaging Studies   No results found.  Assessment   *Positive Cologuard   PLAN   Colonoscopy. ASA 2.  I have discussed the risks, alternatives, benefits with regards to but not limited to the risk of reaction to medication, bleeding, infection, perforation and the patient is agreeable to proceed. Written consent to be obtained.    Leanna Battles. Melvyn Neth, MHS, PA-C [facility]

## 2023-05-06 NOTE — Patient Instructions (Signed)
Colonoscopy to be scheduled. See separate instructions.  ?

## 2023-05-12 ENCOUNTER — Encounter: Payer: Self-pay | Admitting: Gastroenterology

## 2023-05-25 ENCOUNTER — Other Ambulatory Visit (HOSPITAL_COMMUNITY)
Admission: RE | Admit: 2023-05-25 | Discharge: 2023-05-25 | Disposition: A | Discharge status: Home / Self Care | Payer: BC Managed Care – PPO | Source: Ambulatory Visit | Attending: Internal Medicine | Admitting: Internal Medicine

## 2023-05-25 DIAGNOSIS — R195 Other fecal abnormalities: Secondary | ICD-10-CM | POA: Insufficient documentation

## 2023-05-25 LAB — BASIC METABOLIC PANEL
Anion gap: 8 (ref 5–15)
BUN: 12 mg/dL (ref 6–20)
CO2: 28 mmol/L (ref 22–32)
Calcium: 8.8 mg/dL — ABNORMAL LOW (ref 8.9–10.3)
Chloride: 103 mmol/L (ref 98–111)
Creatinine, Ser: 0.95 mg/dL (ref 0.61–1.24)
GFR, Estimated: >60 mL/min (ref >60)
Glucose, Bld: 117 mg/dL — ABNORMAL HIGH (ref 70–99)
Potassium: 4.1 mmol/L (ref 3.5–5.1)
Sodium: 139 mmol/L (ref 135–145)

## 2023-06-03 NOTE — Anesthesia Preprocedure Evaluation (Signed)
Anesthesia Evaluation  Patient identified by MRN, date of birth, ID band Patient awake    Reviewed: Allergy & Precautions, H&P , NPO status , Patient's Chart, lab work & pertinent test results, reviewed documented beta blocker date and time   Airway Mallampati: II  TM Distance: >3 FB Neck ROM: full    Dental no notable dental hx. (+) Dental Advisory Given, Teeth Intact   Pulmonary neg pulmonary ROS, Current Smoker   Pulmonary exam normal breath sounds clear to auscultation       Cardiovascular Exercise Tolerance: Good hypertension, Normal cardiovascular exam Rhythm:regular Rate:Normal     Neuro/Psych negative neurological ROS  negative psych ROS   GI/Hepatic negative GI ROS, Neg liver ROS,,,  Endo/Other  negative endocrine ROS    Renal/GU negative Renal ROS  negative genitourinary   Musculoskeletal   Abdominal   Peds  Hematology negative hematology ROS (+)   Anesthesia Other Findings   Reproductive/Obstetrics negative OB ROS                             Anesthesia Physical Anesthesia Plan  ASA: 2  Anesthesia Plan: General   Post-op Pain Management: Minimal or no pain anticipated   Induction: Intravenous  PONV Risk Score and Plan:   Airway Management Planned: Nasal Cannula and Natural Airway  Additional Equipment: None  Intra-op Plan:   Post-operative Plan:   Informed Consent: I have reviewed the patients History and Physical, chart, labs and discussed the procedure including the risks, benefits and alternatives for the proposed anesthesia with the patient or authorized representative who has indicated his/her understanding and acceptance.     Dental Advisory Given  Plan Discussed with: CRNA  Anesthesia Plan Comments:         Anesthesia Quick Evaluation

## 2023-06-04 ENCOUNTER — Ambulatory Visit (HOSPITAL_COMMUNITY)
Admission: RE | Admit: 2023-06-04 | Discharge: 2023-06-04 | Disposition: A | Discharge status: Home / Self Care | Payer: BC Managed Care – PPO | Attending: Internal Medicine | Admitting: Internal Medicine

## 2023-06-04 ENCOUNTER — Encounter (HOSPITAL_COMMUNITY): Payer: Self-pay | Admitting: Internal Medicine

## 2023-06-04 ENCOUNTER — Ambulatory Visit (HOSPITAL_COMMUNITY): Payer: BC Managed Care – PPO | Admitting: Anesthesiology

## 2023-06-04 ENCOUNTER — Other Ambulatory Visit: Payer: Self-pay

## 2023-06-04 ENCOUNTER — Ambulatory Visit (HOSPITAL_COMMUNITY): Payer: Self-pay | Admitting: Anesthesiology

## 2023-06-04 ENCOUNTER — Encounter (HOSPITAL_COMMUNITY)
Admission: RE | Disposition: A | Discharge status: Home / Self Care | Payer: Self-pay | Source: Home / Self Care | Attending: Internal Medicine

## 2023-06-04 ENCOUNTER — Other Ambulatory Visit: Payer: Self-pay | Admitting: Internal Medicine

## 2023-06-04 DIAGNOSIS — D124 Benign neoplasm of descending colon: Secondary | ICD-10-CM | POA: Diagnosis not present

## 2023-06-04 DIAGNOSIS — I10 Essential (primary) hypertension: Secondary | ICD-10-CM | POA: Diagnosis not present

## 2023-06-04 DIAGNOSIS — F17290 Nicotine dependence, other tobacco product, uncomplicated: Secondary | ICD-10-CM | POA: Insufficient documentation

## 2023-06-04 DIAGNOSIS — D125 Benign neoplasm of sigmoid colon: Secondary | ICD-10-CM | POA: Diagnosis not present

## 2023-06-04 DIAGNOSIS — R195 Other fecal abnormalities: Secondary | ICD-10-CM | POA: Insufficient documentation

## 2023-06-04 DIAGNOSIS — Z1211 Encounter for screening for malignant neoplasm of colon: Secondary | ICD-10-CM | POA: Insufficient documentation

## 2023-06-04 DIAGNOSIS — K5730 Diverticulosis of large intestine without perforation or abscess without bleeding: Secondary | ICD-10-CM | POA: Diagnosis not present

## 2023-06-04 DIAGNOSIS — F17200 Nicotine dependence, unspecified, uncomplicated: Secondary | ICD-10-CM | POA: Diagnosis not present

## 2023-06-04 DIAGNOSIS — K635 Polyp of colon: Secondary | ICD-10-CM | POA: Diagnosis not present

## 2023-06-04 DIAGNOSIS — M48062 Spinal stenosis, lumbar region with neurogenic claudication: Secondary | ICD-10-CM

## 2023-06-04 HISTORY — PX: COLONOSCOPY WITH PROPOFOL: SHX5780

## 2023-06-04 HISTORY — PX: POLYPECTOMY: SHX5525

## 2023-06-04 SURGERY — COLONOSCOPY WITH PROPOFOL
Anesthesia: General

## 2023-06-04 MED ORDER — PROPOFOL 10 MG/ML IV BOLUS
INTRAVENOUS | Status: DC | PRN
  Administered 2023-06-04: 100 mg via INTRAVENOUS
  Administered 2023-06-04: 50 mg via INTRAVENOUS

## 2023-06-04 MED ORDER — LACTATED RINGERS IV SOLN: INTRAVENOUS | Status: DC | PRN

## 2023-06-04 MED ORDER — STERILE WATER FOR IRRIGATION IR SOLN
Status: DC | PRN
  Administered 2023-06-04: 100 mL

## 2023-06-04 MED ORDER — PROPOFOL 500 MG/50ML IV EMUL
INTRAVENOUS | Status: DC | PRN
  Administered 2023-06-04: 150 ug/kg/min via INTRAVENOUS

## 2023-06-04 NOTE — Anesthesia Postprocedure Evaluation (Signed)
Anesthesia Post Note  Patient: Gregory Marsh  Procedure(s) Performed: COLONOSCOPY WITH PROPOFOL POLYPECTOMY  Patient location during evaluation: PACU Anesthesia Type: General Level of consciousness: awake and alert Pain management: pain level controlled Vital Signs Assessment: post-procedure vital signs reviewed and stable Respiratory status: spontaneous breathing, nonlabored ventilation, respiratory function stable and patient connected to nasal cannula oxygen Cardiovascular status: blood pressure returned to baseline and stable Postop Assessment: no apparent nausea or vomiting Anesthetic complications: no   There were no known notable events for this encounter.   Last Vitals:  Vitals:   06/04/23 0647 06/04/23 0814  BP: (!) 147/99 100/67  Pulse: 65 80  Resp: 18 16  Temp: 36.7 C 36.5 C  SpO2: 97% 100%    Last Pain:  Vitals:   06/04/23 0814  TempSrc:   PainSc: 0-No pain                 Arlon Bleier L Marley Charlot

## 2023-06-04 NOTE — Discharge Instructions (Signed)
  Colonoscopy Discharge Instructions  Read the instructions outlined below and refer to this sheet in the next few weeks. These discharge instructions provide you with general information on caring for yourself after you leave the hospital. Your doctor may also give you specific instructions. While your treatment has been planned according to the most current medical practices available, unavoidable complications occasionally occur. If you have any problems or questions after discharge, call Dr. Jena Gauss at [phone]. ACTIVITY You may resume your regular activity, but move at a slower pace for the next 24 hours.  Take frequent rest periods for the next 24 hours.  Walking will help get rid of the air and reduce the bloated feeling in your belly (abdomen).  No driving for 24 hours (because of the medicine (anesthesia) used during the test).   Do not sign any important legal documents or operate any machinery for 24 hours (because of the anesthesia used during the test).  NUTRITION Drink plenty of fluids.  You may resume your normal diet as instructed by your doctor.  Begin with a light meal and progress to your normal diet. Heavy or fried foods are harder to digest and may make you feel sick to your stomach (nauseated).  Avoid alcoholic beverages for 24 hours or as instructed.  MEDICATIONS You may resume your normal medications unless your doctor tells you otherwise.  WHAT YOU CAN EXPECT TODAY Some feelings of bloating in the abdomen.  Passage of more gas than usual.  Spotting of blood in your stool or on the toilet paper.  IF YOU HAD POLYPS REMOVED DURING THE COLONOSCOPY: No aspirin products for 7 days or as instructed.  No alcohol for 7 days or as instructed.  Eat a soft diet for the next 24 hours.  FINDING OUT THE RESULTS OF YOUR TEST Not all test results are available during your visit. If your test results are not back during the visit, make an appointment with your caregiver to find out the  results. Do not assume everything is normal if you have not heard from your caregiver or the medical facility. It is important for you to follow up on all of your test results.  SEEK IMMEDIATE MEDICAL ATTENTION IF: You have more than a spotting of blood in your stool.  Your belly is swollen (abdominal distention).  You are nauseated or vomiting.  You have a temperature over 101.  You have abdominal pain or discomfort that is severe or gets worse throughout the day.     4 polyps removed from your colon today  Colon polyp and diverticulosis information provided  Further recommendations to follow pending review of pathology report  At patient request, I called Jolee Ewing at [phone] went to voicemail.  Left a message.

## 2023-06-04 NOTE — Transfer of Care (Signed)
Immediate Anesthesia Transfer of Care Note  Patient: Gregory Marsh  Procedure(s) Performed: COLONOSCOPY WITH PROPOFOL POLYPECTOMY  Patient Location: Endoscopy Unit  Anesthesia Type:General  Level of Consciousness: drowsy  Airway & Oxygen Therapy: Patient Spontanous Breathing  Post-op Assessment: Report given to RN and Post -op Vital signs reviewed and stable  Post vital signs: Reviewed and stable  Last Vitals:  Vitals Value Taken Time  BP 100/67 06/04/23 0814  Temp 36.5 C 06/04/23 0814  Pulse 80 06/04/23 0814  Resp 16 06/04/23 0814  SpO2 100 % 06/04/23 0814    Last Pain:  Vitals:   06/04/23 0814  TempSrc:   PainSc: 0-No pain      Patients Stated Pain Goal: 10 (06/04/23 4098)  Complications: No notable events documented.

## 2023-06-04 NOTE — Interval H&P Note (Signed)
History and Physical Interval Note:  06/04/2023 7:25 AM  Gregory Marsh  has presented today for surgery, with the diagnosis of positive cologuard.  The various methods of treatment have been discussed with the patient and family. After consideration of risks, benefits and other options for treatment, the patient has consented to  Procedure(s) with comments: COLONOSCOPY WITH PROPOFOL (N/A) - 7:30 am, asa 2 as a surgical intervention.  The patient's history has been reviewed, patient examined, no change in status, stable for surgery.  I have reviewed the patient's chart and labs.  Questions were answered to the patient's satisfaction.     Gregory Marsh  Positive Cologuard.  No change.  Diagnostic colonoscopy today per plan.  He denies any bowel symptoms. The risks, benefits, limitations, alternatives and imponderables have been reviewed with the patient. Questions have been answered. All parties are agreeable.

## 2023-06-04 NOTE — Op Note (Signed)
[facility] Patient Name: Gregory Marsh Procedure Date: 06/04/2023 7:33 AM MRN: [id number] Date of Birth: 11/20/1965 Attending MD: Gennette Pac , MD, [id number] CSN: [id number] Age: 57 Admit Type: Outpatient Procedure:                Colonoscopy Indications:              Positive Cologuard test Providers:                Gennette Pac, MD, Buel Ream. Museum/gallery exhibitions officer, Charity fundraiser,                            Judeth Cornfield. Jessee Avers, Technician Referring MD:              Medicines:                Propofol per Anesthesia Complications:            No immediate complications. Estimated Blood Loss:     Estimated blood loss was minimal. Procedure:                Pre-Anesthesia Assessment:                           - Prior to the procedure, a History and Physical                            was performed, and patient medications and                            allergies were reviewed. The patient's tolerance of                            previous anesthesia was also reviewed. The risks                            and benefits of the procedure and the sedation                            options and risks were discussed with the patient.                            All questions were answered, and informed consent                            was obtained. Prior Anticoagulants: The patient has                            taken no anticoagulant or antiplatelet agents. ASA                            Grade Assessment: III - A patient with severe                            systemic disease. After reviewing the risks and  benefits, the patient was deemed in satisfactory                            condition to undergo the procedure.                           After obtaining informed consent, the colonoscope                            was passed under direct vision. Throughout the                            procedure, the patient's blood pressure, pulse, and                             oxygen saturations were monitored continuously. The                            [id number]) scope was introduced through the                            anus and advanced to the the cecum, identified by                            appendiceal orifice and ileocecal valve. The                            colonoscopy was performed without difficulty. The                            patient tolerated the procedure well. The quality                            of the bowel preparation was adequate. The                            ileocecal valve, appendiceal orifice, and rectum                            were photographed. Scope In: 7:45:06 AM Scope Out: 8:11:31 AM Scope Withdrawal Time: 0 hours 22 minutes 7 seconds  Total Procedure Duration: 0 hours 26 minutes 25 seconds  Findings:      The perianal and digital rectal examinations were normal.      Two semi-pedunculated polyps were found in the descending colon. The       polyps were 4 to 5 mm in size. These polyps were removed with a cold       snare. Resection and retrieval were complete. Estimated blood loss was       minimal.      Two pedunculated polyps were found in the sigmoid colon. The polyps were       6 to 8 mm in size. These polyps were removed with a hot snare. Resection       and retrieval were complete. Estimated blood loss was minimal.      Scattered medium-mouthed diverticula  were found in the sigmoid colon.      The exam was otherwise without abnormality on direct and retroflexion       views. Impression:               - Two 4 to 5 mm polyps in the descending colon,                            removed with a cold snare. Resected and retrieved.                           - Two 6 to 8 mm polyps in the sigmoid colon,                            removed with a hot snare. Resected and retrieved.                           - Diverticulosis in the sigmoid colon.                           - The examination was otherwise normal on  direct                            and retroflexion views. Moderate Sedation:      Moderate (conscious) sedation was personally administered by an       anesthesia professional. The following parameters were monitored: oxygen       saturation, heart rate, blood pressure, respiratory rate, EKG, adequacy       of pulmonary ventilation, and response to care. Recommendation:           - Patient has a contact number available for                            emergencies. The signs and symptoms of potential                            delayed complications were discussed with the                            patient. Return to normal activities tomorrow.                            Written discharge instructions were provided to the                            patient.                           - Advance diet as tolerated.                           - Continue present medications.                           - Repeat colonoscopy date to be determined after  pending pathology results are reviewed for                            surveillance.                           - Return to GI office (date not yet determined). Procedure Code(s):        --- Professional ---                           [phone], Colonoscopy, flexible; with removal of                            tumor(s), polyp(s), or other lesion(s) by snare                            technique Diagnosis Code(s):        --- Professional ---                           D12.4, Benign neoplasm of descending colon                           D12.5, Benign neoplasm of sigmoid colon                           R19.5, Other fecal abnormalities                           K57.30, Diverticulosis of large intestine without                            perforation or abscess without bleeding CPT copyright 2022 American Medical Association. All rights reserved. The codes documented in this report are preliminary and upon coder review may  be revised to  meet current compliance requirements. Gerrit Friends. Padme Arriaga, MD Gennette Pac, MD 06/04/2023 8:22:21 AM This report has been signed electronically. Number of Addenda: 0

## 2023-06-08 ENCOUNTER — Encounter: Payer: Self-pay | Admitting: Internal Medicine

## 2023-06-08 LAB — SURGICAL PATHOLOGY

## 2023-06-10 ENCOUNTER — Encounter (HOSPITAL_COMMUNITY): Payer: Self-pay | Admitting: Internal Medicine

## 2023-06-10 ENCOUNTER — Ambulatory Visit: Payer: BC Managed Care – PPO | Admitting: Physical Medicine and Rehabilitation

## 2023-06-23 ENCOUNTER — Telehealth: Payer: Self-pay

## 2023-06-23 ENCOUNTER — Other Ambulatory Visit: Payer: Self-pay

## 2023-06-23 ENCOUNTER — Other Ambulatory Visit: Payer: Self-pay | Admitting: Internal Medicine

## 2023-06-23 DIAGNOSIS — N529 Male erectile dysfunction, unspecified: Secondary | ICD-10-CM

## 2023-06-23 DIAGNOSIS — L239 Allergic contact dermatitis, unspecified cause: Secondary | ICD-10-CM

## 2023-06-23 MED ORDER — SILDENAFIL CITRATE 50 MG PO TABS: 50.0000 mg | ORAL_TABLET | ORAL | 1 refills | Status: DC | PRN

## 2023-06-23 NOTE — Telephone Encounter (Signed)
Refills sent to pharmacy. 

## 2023-06-23 NOTE — Telephone Encounter (Signed)
Copied from CRM [id number]. Topic: Clinical - Medication Refill >> Jun 23, 2023  9:51 AM Georgeanna Harrison H wrote: Most Recent Primary Care Visit:  Provider: Anabel Halon  Department: [facility]  Visit Type: OFFICE VISIT  Date: 04/10/2023  Medication: Viagra 50 mg  Has the patient contacted their pharmacy? Yes (Agent: If no, request that the patient contact the pharmacy for the refill. If patient does not wish to contact the pharmacy document the reason why and proceed with request.) (Agent: If yes, when and what did the pharmacy advise?)  Is this the correct pharmacy for this prescription? Yes If no, delete pharmacy and type the correct one.  This is the patient's preferred pharmacy:  [id number] - [place], [place] - [address] AT [facility] [address] [place]  [address] Phone: [phone] Fax: [phone]   Has the prescription been filled recently? Yes  Is the patient out of the medication? Yes  Has the patient been seen for an appointment in the last year OR does the patient have an upcoming appointment? Yes  Can we respond through MyChart? Yes  Agent: Please be advised that Rx refills may take up to 3 business days. We ask that you follow-up with your pharmacy.

## 2023-08-06 ENCOUNTER — Other Ambulatory Visit: Payer: Self-pay | Admitting: Internal Medicine

## 2023-08-06 DIAGNOSIS — L239 Allergic contact dermatitis, unspecified cause: Secondary | ICD-10-CM

## 2023-08-10 ENCOUNTER — Telehealth: Payer: Self-pay | Admitting: Internal Medicine

## 2023-08-10 ENCOUNTER — Other Ambulatory Visit: Payer: Self-pay

## 2023-08-10 DIAGNOSIS — M48062 Spinal stenosis, lumbar region with neurogenic claudication: Secondary | ICD-10-CM

## 2023-08-10 MED ORDER — GABAPENTIN 300 MG PO CAPS: ORAL_CAPSULE | ORAL | 3 refills | Status: DC

## 2023-08-10 NOTE — Telephone Encounter (Signed)
Medication sent.

## 2023-08-10 NOTE — Telephone Encounter (Signed)
Prescription Request  08/10/2023  LOV: 04/10/2023  What is the name of the medication or equipment? gabapentin (NEURONTIN) 300 MG capsule [[id number]]   Have you contacted your pharmacy to request a refill? Yes   Which pharmacy would you like this sent to?  [id number] - [place], [place] - [address] AT [facility] [address] [place] [address] Phone: [phone] Fax: [phone]    Patient notified that their request is being sent to the clinical staff for review and that they should receive a response within 2 business days.   Please advise at  walked in

## 2023-08-26 ENCOUNTER — Ambulatory Visit (INDEPENDENT_AMBULATORY_CARE_PROVIDER_SITE_OTHER): Payer: BC Managed Care – PPO | Admitting: Internal Medicine

## 2023-08-26 ENCOUNTER — Encounter: Payer: Self-pay | Admitting: Internal Medicine

## 2023-08-26 ENCOUNTER — Other Ambulatory Visit: Payer: Self-pay | Admitting: Internal Medicine

## 2023-08-26 VITALS — BP 138/84 | HR 78 | Ht 72.0 in | Wt 205.0 lb

## 2023-08-26 DIAGNOSIS — N529 Male erectile dysfunction, unspecified: Secondary | ICD-10-CM

## 2023-08-26 DIAGNOSIS — I10 Essential (primary) hypertension: Secondary | ICD-10-CM | POA: Diagnosis not present

## 2023-08-26 DIAGNOSIS — R7303 Prediabetes: Secondary | ICD-10-CM | POA: Diagnosis not present

## 2023-08-26 DIAGNOSIS — E559 Vitamin D deficiency, unspecified: Secondary | ICD-10-CM

## 2023-08-26 DIAGNOSIS — Z0001 Encounter for general adult medical examination with abnormal findings: Secondary | ICD-10-CM

## 2023-08-26 DIAGNOSIS — Z125 Encounter for screening for malignant neoplasm of prostate: Secondary | ICD-10-CM

## 2023-08-26 DIAGNOSIS — E782 Mixed hyperlipidemia: Secondary | ICD-10-CM | POA: Diagnosis not present

## 2023-08-26 DIAGNOSIS — M48062 Spinal stenosis, lumbar region with neurogenic claudication: Secondary | ICD-10-CM

## 2023-08-26 MED ORDER — LOSARTAN POTASSIUM-HCTZ 50-12.5 MG PO TABS: 1.0000 | ORAL_TABLET | Freq: Every day | ORAL | 1 refills | Status: DC

## 2023-08-26 MED ORDER — CYCLOBENZAPRINE HCL 5 MG PO TABS: 5.0000 mg | ORAL_TABLET | Freq: Two times a day (BID) | ORAL | 1 refills | Status: DC | PRN

## 2023-08-26 MED ORDER — SILDENAFIL CITRATE 50 MG PO TABS: 50.0000 mg | ORAL_TABLET | ORAL | 1 refills | Status: DC | PRN

## 2023-08-26 NOTE — Assessment & Plan Note (Signed)
Advised to take vitamin D 5000 IU daily 

## 2023-08-26 NOTE — Assessment & Plan Note (Signed)
 Physical exam as documented. Counseling done  re healthy lifestyle involving commitment to 150 minutes exercise per week, heart healthy diet, and attaining healthy weight.The importance of adequate sleep also discussed. Immunization and cancer screening needs are specifically addressed at this visit.

## 2023-08-26 NOTE — Assessment & Plan Note (Signed)
 Has Viagra  PRN, refilled

## 2023-08-26 NOTE — Assessment & Plan Note (Addendum)
 BP Readings from Last 1 Encounters:  08/26/23 138/84   Well-controlled with Losartan -HCTZ Counseled for compliance with the medications Advised DASH diet and moderate exercise/walking, at least 150 mins/week

## 2023-08-26 NOTE — Assessment & Plan Note (Signed)
Lab Results  Component Value Date   HGBA1C 6.3 (H) 01/09/2023   Advised to follow low carb, low cholesterol diet

## 2023-08-26 NOTE — Progress Notes (Signed)
 Established Patient Office Visit  Subjective:  Patient ID: Gregory Marsh, male    DOB: 04-09-1966  Age: 58 y.o. MRN: [id number]  CC:  Chief Complaint  Patient presents with   Annual Exam    HPI Gregory Marsh is a 58 y.o. male with past medical history of HTN and lumbar spinal stenosis who presents for annual physical.  HTN: BP is well-controlled. Takes medications regularly. Patient denies headache, dizziness, chest pain, dyspnea or palpitations.   He continues to have chronic low back pain, which radiates to his left LE. He admits that he has to lift heavy weights at work and has to bend multiple times to move heavy objects, which has worsened his pain in the past.  He has history of lumbar spinal stenosis. He takes Gabapentin  300 mg QAM and 600 mg QPM for it.  He also has Flexeril , which he takes occasionally for back muscle spasms.  He denies any saddle anesthesia, urinary or stool incontinence currently.    He had adequate response with sildenafil  50 mg for erectile dysfunction, requests refill. Denies dysuria, hematuria or urinary hesitancy.    Past Medical History:  Diagnosis Date   AC separation, type 3, left, sequela 12/10/2011   Articular cartilage disorder, shoulder region 01/13/2012   Back pain    HTN (hypertension)    Primary localized osteoarthrosis, shoulder region 01/13/2012    Past Surgical History:  Procedure Laterality Date   COLONOSCOPY WITH PROPOFOL  N/A 06/04/2023   Procedure: COLONOSCOPY WITH PROPOFOL ;  Surgeon: Suzette Espy, MD;  [facility];  Service: Endoscopy;  Laterality: N/A;  7:30 am, asa 2   POLYPECTOMY  06/04/2023   Procedure: POLYPECTOMY;  Surgeon: Suzette Espy, MD;  [facility];  Service: Endoscopy;;   SHOULDER ARTHROSCOPY  01/09/2012   Procedure: ARTHROSCOPY SHOULDER;  Surgeon: Darrin Emerald, MD;  Location: [facility];  Service: Orthopedics;  Laterality: Left;  with debridement   SHOULDER OPEN ROTATOR CUFF REPAIR   01/09/2012   Procedure: ROTATOR CUFF REPAIR SHOULDER OPEN;  Surgeon: Darrin Emerald, MD;  Location: [facility];  Service: Orthopedics;  Laterality: Left;  with distal clavicle excision    Family History  Problem Relation Age of Onset   Diabetes Mother    Diabetes Other    Anesthesia problems Neg Hx    Hypotension Neg Hx    Malignant hyperthermia Neg Hx    Pseudochol deficiency Neg Hx    Colon cancer Neg Hx     Social History   Socioeconomic History   Marital status: Married    Spouse name: Not on file   Number of children: Not on file   Years of education: 12   Highest education level: Not on file  Occupational History   Occupation: Emergency planning/management officer  Tobacco Use   Smoking status: Light Smoker    Types: Cigars   Smokeless tobacco: Never  Vaping Use   Vaping status: Never Used  Substance and Sexual Activity   Alcohol use: Yes    Alcohol/week: 6.0 standard drinks of alcohol    Types: 6 Cans of beer per week    Comment: occasionally   Drug use: Yes    Frequency: 2.0 times per week    Types: Marijuana   Sexual activity: Yes    Birth control/protection: None  Other Topics Concern   Not on file  Social History Narrative   Not on file   Social Drivers of Health   Financial Resource Strain:  Not on file  Food Insecurity: Not on file  Transportation Needs: Not on file  Physical Activity: Not on file  Stress: Not on file  Social Connections: Not on file  Intimate Partner Violence: Not on file    Outpatient Medications Prior to Visit  Medication Sig Dispense Refill   gabapentin  (NEURONTIN ) 300 MG capsule Take 1 capsule (300 mg total) by mouth in the morning AND 2 capsules (600 mg total) at bedtime. 90 capsule 3   naproxen  (NAPROSYN ) 500 MG tablet Take 1 tablet (500 mg total) by mouth 2 (two) times daily with a meal. 60 tablet 5   triamcinolone  cream (KENALOG ) 0.1 % APPLY TO AFFECTED AREA TWICE A DAY 30 g 0   cyclobenzaprine  (FLEXERIL ) 5 MG tablet TAKE 1 TABLET BY MOUTH 2 TIMES  DAILY AS NEEDED FOR MUSCLE SPASMS. 30 tablet 1   losartan -hydrochlorothiazide (HYZAAR) 50-12.5 MG tablet Take 1 tablet by mouth daily. 90 tablet 1   sildenafil  (VIAGRA ) 50 MG tablet Take 1 tablet (50 mg total) by mouth as needed for erectile dysfunction. 6 tablet 1   No facility-administered medications prior to visit.    No Known Allergies  ROS Review of Systems  Constitutional:  Negative for chills and fever.  HENT:  Negative for congestion and sore throat.   Eyes:  Negative for pain and discharge.  Respiratory:  Negative for cough and shortness of breath.   Cardiovascular:  Negative for chest pain and palpitations.  Gastrointestinal:  Negative for constipation, diarrhea, nausea and vomiting.  Endocrine: Negative for polydipsia and polyuria.  Genitourinary:  Negative for dysuria and hematuria.  Musculoskeletal:  Positive for arthralgias (B/l wrist and thumb) and back pain. Negative for neck pain and neck stiffness.  Skin:  Negative for rash.  Neurological:  Negative for dizziness, weakness, numbness and headaches.  Psychiatric/Behavioral:  Negative for agitation and behavioral problems.       Objective:    Physical Exam Vitals reviewed.  Constitutional:      General: He is not in acute distress.    Appearance: He is not diaphoretic.  HENT:     Head: Normocephalic and atraumatic.     Nose: Nose normal.     Mouth/Throat:     Mouth: Mucous membranes are moist.  Eyes:     General: No scleral icterus.    Extraocular Movements: Extraocular movements intact.  Cardiovascular:     Rate and Rhythm: Normal rate and regular rhythm.     Pulses: Normal pulses.     Heart sounds: Normal heart sounds. No murmur heard. Pulmonary:     Breath sounds: Normal breath sounds. No wheezing or rales.  Abdominal:     Palpations: Abdomen is soft.     Tenderness: There is no abdominal tenderness.  Musculoskeletal:        General: Tenderness (Lumbar spine area) present.     Cervical back: Neck  supple. No tenderness.     Right lower leg: No edema.     Left lower leg: No edema.  Skin:    General: Skin is warm.     Findings: No rash.  Neurological:     General: No focal deficit present.     Mental Status: He is alert and oriented to person, place, and time.  Psychiatric:        Mood and Affect: Mood normal.        Behavior: Behavior normal.     BP 138/84 (BP Location: Left Arm)   Pulse 78  Ht 6' (1.829 m)   Wt 205 lb (93 kg)   SpO2 97%   BMI 27.80 kg/m  Wt Readings from Last 3 Encounters:  08/26/23 205 lb (93 kg)  06/04/23 205 lb (93 kg)  05/06/23 204 lb 6.4 oz (92.7 kg)    Lab Results  Component Value Date   TSH 3.230 01/09/2023   Lab Results  Component Value Date   WBC 5.3 01/09/2023   HGB 12.9 (L) 01/09/2023   HCT 39.4 01/09/2023   MCV 96 01/09/2023   PLT 257 01/09/2023   Lab Results  Component Value Date   NA 139 05/25/2023   K 4.1 05/25/2023   CO2 28 05/25/2023   GLUCOSE 117 (H) 05/25/2023   BUN 12 05/25/2023   CREATININE 0.95 05/25/2023   BILITOT 0.3 01/09/2023   ALKPHOS 60 01/09/2023   AST 28 01/09/2023   ALT 30 01/09/2023   PROT 7.0 01/09/2023   ALBUMIN 4.3 01/09/2023   CALCIUM 8.8 (L) 05/25/2023   ANIONGAP 8 05/25/2023   EGFR 97 01/09/2023   Lab Results  Component Value Date   CHOL 196 01/09/2023   Lab Results  Component Value Date   HDL 73 01/09/2023   Lab Results  Component Value Date   LDLCALC 109 (H) 01/09/2023   Lab Results  Component Value Date   TRIG 79 01/09/2023   Lab Results  Component Value Date   CHOLHDL 2.7 01/09/2023   Lab Results  Component Value Date   HGBA1C 6.3 (H) 01/09/2023      Assessment & Plan:   Problem List Items Addressed This Visit       Cardiovascular and Mediastinum   Essential hypertension   BP Readings from Last 1 Encounters:  08/26/23 138/84   Well-controlled with Losartan -HCTZ Counseled for compliance with the medications Advised DASH diet and moderate exercise/walking,  at least 150 mins/week      Relevant Medications   losartan -hydrochlorothiazide (HYZAAR) 50-12.5 MG tablet   sildenafil  (VIAGRA ) 50 MG tablet   Other Relevant Orders   TSH   CMP14+EGFR   CBC with Differential/Platelet     Other   Spinal stenosis of lumbar region (Chronic)   On Gabapentin  to 300 mg QAM and 600 mg QHS Advised to avoid heavy lifting and frequent bending - advised to bring paperwork/forms from workplace for light duty work Flexeril  as needed for muscle spasms Needs spine surgery evaluation-but he prefers to avoid surgery      Relevant Medications   cyclobenzaprine  (FLEXERIL ) 5 MG tablet   Prediabetes (Chronic)   Lab Results  Component Value Date   HGBA1C 6.3 (H) 01/09/2023   Advised to follow low carb, low cholesterol diet      Relevant Orders   Hemoglobin A1c   Encounter for general adult medical examination with abnormal findings - Primary   Physical exam as documented. Counseling done  re healthy lifestyle involving commitment to 150 minutes exercise per week, heart healthy diet, and attaining healthy weight.The importance of adequate sleep also discussed. Immunization and cancer screening needs are specifically addressed at this visit.      Erectile dysfunction   Has Viagra  PRN, refilled      Relevant Medications   sildenafil  (VIAGRA ) 50 MG tablet   Prostate cancer screening   Ordered PSA after discussing its limitations for prostate cancer screening, including false positive results leading to additional investigations.      Relevant Orders   PSA   Mixed hyperlipidemia   Advised to follow low-cholesterol  diet      Relevant Medications   losartan -hydrochlorothiazide (HYZAAR) 50-12.5 MG tablet   sildenafil  (VIAGRA ) 50 MG tablet   Other Relevant Orders   Lipid panel   Vitamin D  deficiency   Advised to take vitamin D  5000 IU daily      Relevant Orders   VITAMIN D  25 Hydroxy (Vit-D Deficiency, Fractures)    Meds ordered this encounter   Medications   losartan -hydrochlorothiazide (HYZAAR) 50-12.5 MG tablet    Sig: Take 1 tablet by mouth daily.    Dispense:  90 tablet    Refill:  1   cyclobenzaprine  (FLEXERIL ) 5 MG tablet    Sig: Take 1 tablet (5 mg total) by mouth 2 (two) times daily as needed for muscle spasms.    Dispense:  30 tablet    Refill:  1   sildenafil  (VIAGRA ) 50 MG tablet    Sig: Take 1 tablet (50 mg total) by mouth as needed for erectile dysfunction.    Dispense:  30 tablet    Refill:  1    Follow-up: Return in about 6 months (around 02/23/2024) for HTN.    Meldon Sport, MD

## 2023-08-26 NOTE — Telephone Encounter (Signed)
 Copied from CRM [id number]. Topic: Clinical - Medication Refill >> Aug 26, 2023 11:22 AM Gregory Marsh wrote: Most Recent Primary Care Visit:  Provider: Meldon Sport  Department: [facility]  Visit Type: PHYSICAL  Date: 08/26/2023  Medication: naproxen  (NAPROSYN ) 500 MG tablet [[id number]]  Has the patient contacted their pharmacy? Yes (Agent: If no, request that the patient contact the pharmacy for the refill. If patient does not wish to contact the pharmacy document the reason why and proceed with request.) (Agent: If yes, when and what did the pharmacy advise?)  Is this the correct pharmacy for this prescription? Yes If no, delete pharmacy and type the correct one.  This is the patient's preferred pharmacy:  [id number] [place], [place] - [address] AT [facility] [address] [place]  [address] Phone: [phone] Fax: [phone]   Has the prescription been filled recently? No  Is the patient out of the medication? Yes  Has the patient been seen for an appointment in the last year OR does the patient have an upcoming appointment? Yes  Can we respond through MyChart? No  Agent: Please be advised that Rx refills may take up to 3 business days. We ask that you follow-up with your pharmacy.

## 2023-08-26 NOTE — Assessment & Plan Note (Signed)
Advised to follow low cholesterol diet 

## 2023-08-26 NOTE — Assessment & Plan Note (Signed)
 On Gabapentin  to 300 mg QAM and 600 mg QHS Advised to avoid heavy lifting and frequent bending - advised to bring paperwork/forms from workplace for light duty work Flexeril  as needed for muscle spasms Needs spine surgery evaluation-but he prefers to avoid surgery

## 2023-08-26 NOTE — Assessment & Plan Note (Signed)
 Ordered PSA after discussing its limitations for prostate cancer screening, including false positive results leading to additional investigations.

## 2023-08-26 NOTE — Patient Instructions (Addendum)
 Please continue to take medications as prescribed.  Please continue to follow DASH diet and perform moderate exercise/walking at least 150 mins/week.

## 2023-08-26 NOTE — Telephone Encounter (Signed)
 Copied from CRM [id number]. Topic: Clinical - Medication Refill >> Aug 26, 2023 11:37 AM Ofelia Bent wrote: Most Recent Primary Care Visit:  Provider: Meldon Sport  Department: [facility]  Visit Type: PHYSICAL  Date: 08/26/2023  Medication: ***  Has the patient contacted their pharmacy?  (Agent: If no, request that the patient contact the pharmacy for the refill. If patient does not wish to contact the pharmacy document the reason why and proceed with request.) (Agent: If yes, when and what did the pharmacy advise?)  Is this the correct pharmacy for this prescription?  If no, delete pharmacy and type the correct one.  This is the patient's preferred pharmacy:  [id number] - [place], [place] - [address] AT [facility] [address]  [place] [address] Phone: [phone] Fax: [phone]   Has the prescription been filled recently?   Is the patient out of the medication?   Has the patient been seen for an appointment in the last year OR does the patient have an upcoming appointment?   Can we respond through MyChart?   Agent: Please be advised that Rx refills may take up to 3 business days. We ask that you follow-up with your pharmacy.

## 2023-10-20 ENCOUNTER — Telehealth: Payer: Self-pay | Admitting: Internal Medicine

## 2023-10-20 ENCOUNTER — Other Ambulatory Visit: Payer: Self-pay

## 2023-10-20 DIAGNOSIS — M48062 Spinal stenosis, lumbar region with neurogenic claudication: Secondary | ICD-10-CM

## 2023-10-20 MED ORDER — GABAPENTIN 300 MG PO CAPS: ORAL_CAPSULE | ORAL | 3 refills | Status: DC

## 2023-10-20 MED ORDER — NAPROXEN 500 MG PO TABS: 500.0000 mg | ORAL_TABLET | Freq: Two times a day (BID) | ORAL | 5 refills | Status: AC

## 2023-10-20 MED ORDER — CYCLOBENZAPRINE HCL 5 MG PO TABS: 5.0000 mg | ORAL_TABLET | Freq: Two times a day (BID) | ORAL | 1 refills | Status: DC | PRN

## 2023-10-20 NOTE — Telephone Encounter (Signed)
 Prescription Request  10/20/2023  LOV: 08/26/2023  What is the name of the medication or equipment? cyclobenzaprine (FLEXERIL) 5 MG tablet [[id number]]  (90 day supply)  gabapentin (NEURONTIN) 300 MG capsule [[id number]] (90 day supply)  losartan-hydrochlorothiazide (HYZAAR) 50-12.5 MG tablet [[id number]] (90 day supply)  sildenafil (VIAGRA) 50 MG tablet [[id number]] (90 day supply)  Have you contacted your pharmacy to request a refill? Yes   Which pharmacy would you like this sent to?  [id number] - [place], [place] - [address] AT [facility] [address] [place] [address] Phone: [phone] Fax: [phone]    Patient notified that their request is being sent to the clinical staff for review and that they should receive a response within 2 business days.   Please advise at  walked in

## 2023-10-20 NOTE — Telephone Encounter (Signed)
 Left message pt should have refills on medications.

## 2023-11-30 ENCOUNTER — Other Ambulatory Visit (HOSPITAL_COMMUNITY): Payer: Self-pay

## 2023-11-30 ENCOUNTER — Encounter: Payer: Self-pay | Admitting: Internal Medicine

## 2023-11-30 ENCOUNTER — Telehealth: Payer: Self-pay | Admitting: Pharmacy Technician

## 2023-11-30 ENCOUNTER — Ambulatory Visit: Admitting: Internal Medicine

## 2023-11-30 VITALS — BP 138/78 | HR 79 | Ht 72.0 in | Wt 213.6 lb

## 2023-11-30 DIAGNOSIS — M5442 Lumbago with sciatica, left side: Secondary | ICD-10-CM | POA: Diagnosis not present

## 2023-11-30 DIAGNOSIS — M48062 Spinal stenosis, lumbar region with neurogenic claudication: Secondary | ICD-10-CM | POA: Diagnosis not present

## 2023-11-30 DIAGNOSIS — N529 Male erectile dysfunction, unspecified: Secondary | ICD-10-CM | POA: Diagnosis not present

## 2023-11-30 MED ORDER — TRAMADOL HCL 50 MG PO TABS: 50.0000 mg | ORAL_TABLET | Freq: Two times a day (BID) | ORAL | 0 refills | Status: DC | PRN

## 2023-11-30 MED ORDER — SILDENAFIL CITRATE 50 MG PO TABS: 50.0000 mg | ORAL_TABLET | ORAL | 1 refills | Status: DC | PRN

## 2023-11-30 MED ORDER — CYCLOBENZAPRINE HCL 5 MG PO TABS: 5.0000 mg | ORAL_TABLET | Freq: Two times a day (BID) | ORAL | 1 refills | Status: DC | PRN

## 2023-11-30 MED ORDER — KETOROLAC TROMETHAMINE 60 MG/2ML IM SOLN
60.0000 mg | Freq: Once | INTRAMUSCULAR | Status: AC
  Administered 2023-11-30: 60 mg via INTRAMUSCULAR

## 2023-11-30 NOTE — Patient Instructions (Signed)
 Please take Tramadol  for severe pain.  Please apply heating pad and use back brace as needed for back support.  Please get x-ray of lumbar spine and sacrum done at [facility].

## 2023-11-30 NOTE — Progress Notes (Signed)
 Acute Office Visit  Subjective:    Patient ID: Gregory Marsh, male    DOB: [birth year], 58 y.o.   MRN: [id number]  Chief Complaint  Patient presents with   Back Pain    Had a fall yesterday, back in pain , pain level is a 10/10. Pain is in low back.     HPI Patient is in today for acute on chronic low back pain since yesterday.  He had a fall on 11/29/23.  He had a fall on his deck due to slipping.  He fell on his back.  His pain is constant, sharp, 10/10 and is radiating to left LE.  He has chronic low back pain, which radiates to his left LE. He admits that he has to lift heavy weights at work and has to bend multiple times to move heavy objects, which has worsened his pain in the past.  He has history of lumbar spinal stenosis. He takes Gabapentin  300 mg QAM and 600 mg QPM for it.  He also has Flexeril , which he takes occasionally for back muscle spasms.  He denies any saddle anesthesia, urinary or stool incontinence currently.   Past Medical History:  Diagnosis Date   AC separation, type 3, left, sequela 12/10/2011   Articular cartilage disorder, shoulder region 01/13/2012   Back pain    HTN (hypertension)    Primary localized osteoarthrosis, shoulder region 01/13/2012    Past Surgical History:  Procedure Laterality Date   COLONOSCOPY WITH PROPOFOL  N/A 06/04/2023   Procedure: COLONOSCOPY WITH PROPOFOL ;  Surgeon: Suzette Espy, MD;  [facility];  Service: Endoscopy;  Laterality: N/A;  7:30 am, asa 2   POLYPECTOMY  06/04/2023   Procedure: POLYPECTOMY;  Surgeon: Suzette Espy, MD;  [facility];  Service: Endoscopy;;   SHOULDER ARTHROSCOPY  01/09/2012   Procedure: ARTHROSCOPY SHOULDER;  Surgeon: Darrin Emerald, MD;  Location: [facility];  Service: Orthopedics;  Laterality: Left;  with debridement   SHOULDER OPEN ROTATOR CUFF REPAIR  01/09/2012   Procedure: ROTATOR CUFF REPAIR SHOULDER OPEN;  Surgeon: Darrin Emerald, MD;  Location: [facility];  Service:  Orthopedics;  Laterality: Left;  with distal clavicle excision    Family History  Problem Relation Age of Onset   Diabetes Mother    Diabetes Other    Anesthesia problems Neg Hx    Hypotension Neg Hx    Malignant hyperthermia Neg Hx    Pseudochol deficiency Neg Hx    Colon cancer Neg Hx     Social History   Socioeconomic History   Marital status: Married    Spouse name: Not on file   Number of children: Not on file   Years of education: 12   Highest education level: Not on file  Occupational History   Occupation: Emergency planning/management officer  Tobacco Use   Smoking status: Light Smoker    Types: Cigars   Smokeless tobacco: Never  Vaping Use   Vaping status: Never Used  Substance and Sexual Activity   Alcohol use: Yes    Alcohol/week: 6.0 standard drinks of alcohol    Types: 6 Cans of beer per week    Comment: occasionally   Drug use: Yes    Frequency: 2.0 times per week    Types: Marijuana   Sexual activity: Yes    Birth control/protection: None  Other Topics Concern   Not on file  Social History Narrative   Not on file   Social Drivers of Health  Financial Resource Strain: Not on file  Food Insecurity: Not on file  Transportation Needs: Not on file  Physical Activity: Not on file  Stress: Not on file  Social Connections: Not on file  Intimate Partner Violence: Not on file    Outpatient Medications Prior to Visit  Medication Sig Dispense Refill   gabapentin  (NEURONTIN ) 300 MG capsule Take 1 capsule (300 mg total) by mouth in the morning AND 2 capsules (600 mg total) at bedtime. 90 capsule 3   losartan -hydrochlorothiazide (HYZAAR) 50-12.5 MG tablet Take 1 tablet by mouth daily. 90 tablet 1   naproxen  (NAPROSYN ) 500 MG tablet Take 1 tablet (500 mg total) by mouth 2 (two) times daily with a meal. 60 tablet 5   triamcinolone  cream (KENALOG ) 0.1 % APPLY TO AFFECTED AREA TWICE A DAY 30 g 0   cyclobenzaprine  (FLEXERIL ) 5 MG tablet Take 1 tablet (5 mg total) by mouth 2 (two) times  daily as needed for muscle spasms. 30 tablet 1   sildenafil  (VIAGRA ) 50 MG tablet Take 1 tablet (50 mg total) by mouth as needed for erectile dysfunction. 30 tablet 1   No facility-administered medications prior to visit.    No Known Allergies  Review of Systems  Constitutional:  Negative for chills and fever.  HENT:  Negative for congestion and sore throat.   Eyes:  Negative for pain and discharge.  Respiratory:  Negative for cough and shortness of breath.   Cardiovascular:  Negative for chest pain and palpitations.  Gastrointestinal:  Negative for diarrhea, nausea and vomiting.  Endocrine: Negative for polydipsia and polyuria.  Genitourinary:  Negative for dysuria and hematuria.  Musculoskeletal:  Positive for arthralgias (B/l wrist and thumb) and back pain. Negative for neck pain and neck stiffness.  Skin:  Negative for rash.  Neurological:  Negative for dizziness, weakness, numbness and headaches.  Psychiatric/Behavioral:  Negative for agitation and behavioral problems.        Objective:    Physical Exam Vitals reviewed.  Constitutional:      General: He is not in acute distress.    Appearance: He is not diaphoretic.  HENT:     Head: Normocephalic and atraumatic.     Nose: Nose normal.     Mouth/Throat:     Mouth: Mucous membranes are moist.  Eyes:     General: No scleral icterus.    Extraocular Movements: Extraocular movements intact.  Cardiovascular:     Rate and Rhythm: Normal rate and regular rhythm.     Pulses: Normal pulses.     Heart sounds: Normal heart sounds. No murmur heard. Pulmonary:     Breath sounds: Normal breath sounds. No wheezing or rales.  Musculoskeletal:     Cervical back: Neck supple. No tenderness.     Lumbar back: Tenderness present. Decreased range of motion.     Right lower leg: No edema.     Left lower leg: No edema.  Skin:    General: Skin is warm.     Findings: No rash.  Neurological:     General: No focal deficit present.      Mental Status: He is alert and oriented to person, place, and time.  Psychiatric:        Mood and Affect: Mood normal.        Behavior: Behavior normal.     BP 138/78 (BP Location: Left Arm)   Pulse 79   Ht 6' (1.829 m)   Wt 213 lb 9.6 oz (96.9 kg)  SpO2 95%   BMI 28.97 kg/m  Wt Readings from Last 3 Encounters:  11/30/23 213 lb 9.6 oz (96.9 kg)  08/26/23 205 lb (93 kg)  06/04/23 205 lb (93 kg)        Assessment & Plan:   Problem List Items Addressed This Visit       Nervous and Auditory   Acute midline low back pain with left-sided sciatica - Primary   Likely due to recent fall Check x-ray of lumbar spine and sacrum/coccyx to rule out fracture Toradol  60 mg IM today Tramadol  as needed for severe pain Flexeril  as needed for muscle spasms On Gabapentin  to 300 mg QAM and 600 mg QHS Advised to avoid heavy lifting and frequent bending - advised to bring paperwork/forms from workplace for light duty work Needs spine surgery evaluation for lumbar spinal stenosis-but he prefers to avoid surgery      Relevant Medications   traMADol  (ULTRAM ) 50 MG tablet   cyclobenzaprine  (FLEXERIL ) 5 MG tablet   Other Relevant Orders   DG Lumbar Spine Complete (Completed)   DG Sacrum/Coccyx (Completed)     Other   Spinal stenosis of lumbar region (Chronic)   On Gabapentin  to 300 mg QAM and 600 mg QHS Advised to avoid heavy lifting and frequent bending - advised to bring paperwork/forms from workplace for light duty work Flexeril  as needed for muscle spasms Needs spine surgery evaluation-but he prefers to avoid surgery      Relevant Medications   cyclobenzaprine  (FLEXERIL ) 5 MG tablet   Other Relevant Orders   DG Lumbar Spine Complete (Completed)   Erectile dysfunction   Has Viagra  PRN, refilled      Relevant Medications   sildenafil  (VIAGRA ) 50 MG tablet     Meds ordered this encounter  Medications   traMADol  (ULTRAM ) 50 MG tablet    Sig: Take 1 tablet (50 mg total) by  mouth every 12 (twelve) hours as needed.    Dispense:  20 tablet    Refill:  0   cyclobenzaprine  (FLEXERIL ) 5 MG tablet    Sig: Take 1 tablet (5 mg total) by mouth 2 (two) times daily as needed for muscle spasms.    Dispense:  30 tablet    Refill:  1   sildenafil  (VIAGRA ) 50 MG tablet    Sig: Take 1 tablet (50 mg total) by mouth as needed for erectile dysfunction.    Dispense:  30 tablet    Refill:  1   ketorolac  (TORADOL ) injection 60 mg     Meldon Sport, MD

## 2023-11-30 NOTE — Telephone Encounter (Signed)
 Pharmacy Patient Advocate Encounter  Received notification from [facility] that Prior Authorization for traMADol  HCl 50MG  tablets has been APPROVED from 11/30/2023 to 12/30/2023. Ran test claim, Copay is $0.42. This test claim was processed through [facility]- copay amounts may vary at other pharmacies due to pharmacy/plan contracts, or as the patient moves through the different stages of their insurance plan.   PA #/Case ID/Reference #: [id number]

## 2023-11-30 NOTE — Telephone Encounter (Signed)
 Pharmacy Patient Advocate Encounter   Received notification from CoverMyMeds that prior authorization for traMADol  HCl 50MG  tablets is required/requested.   Insurance verification completed.   The patient is insured through [facility] .   Per test claim: PA required; PA submitted to above mentioned insurance via CoverMyMeds Key/confirmation #/EOC [id number] Status is pending

## 2023-12-02 ENCOUNTER — Ambulatory Visit (HOSPITAL_COMMUNITY)
Admission: RE | Admit: 2023-12-02 | Discharge: 2023-12-02 | Disposition: A | Discharge status: Home / Self Care | Source: Ambulatory Visit | Attending: Internal Medicine | Admitting: Internal Medicine

## 2023-12-02 ENCOUNTER — Telehealth: Payer: Self-pay | Admitting: Internal Medicine

## 2023-12-02 DIAGNOSIS — M5136 Other intervertebral disc degeneration, lumbar region with discogenic back pain only: Secondary | ICD-10-CM | POA: Diagnosis not present

## 2023-12-02 DIAGNOSIS — M5442 Lumbago with sciatica, left side: Secondary | ICD-10-CM | POA: Insufficient documentation

## 2023-12-02 DIAGNOSIS — M48062 Spinal stenosis, lumbar region with neurogenic claudication: Secondary | ICD-10-CM | POA: Diagnosis not present

## 2023-12-02 DIAGNOSIS — M47816 Spondylosis without myelopathy or radiculopathy, lumbar region: Secondary | ICD-10-CM | POA: Diagnosis not present

## 2023-12-02 NOTE — Telephone Encounter (Signed)
 Left detailed vm for pt letting him know he can purchase otc.

## 2023-12-02 NOTE — Telephone Encounter (Signed)
 Patient here he wants to know if he is able to get a back brace if called in sent to [facility]- requesting a call back to let him know whether it's approved or not. Please advise Thank you

## 2023-12-04 NOTE — Assessment & Plan Note (Signed)
 Has Viagra  PRN, refilled

## 2023-12-04 NOTE — Assessment & Plan Note (Signed)
 On Gabapentin  to 300 mg QAM and 600 mg QHS Advised to avoid heavy lifting and frequent bending - advised to bring paperwork/forms from workplace for light duty work Flexeril  as needed for muscle spasms Needs spine surgery evaluation-but he prefers to avoid surgery

## 2023-12-04 NOTE — Assessment & Plan Note (Signed)
 Likely due to recent fall Check x-ray of lumbar spine and sacrum/coccyx to rule out fracture Toradol  60 mg IM today Tramadol  as needed for severe pain Flexeril  as needed for muscle spasms On Gabapentin  to 300 mg QAM and 600 mg QHS Advised to avoid heavy lifting and frequent bending - advised to bring paperwork/forms from workplace for light duty work Needs spine surgery evaluation for lumbar spinal stenosis-but he prefers to avoid surgery

## 2024-01-05 ENCOUNTER — Other Ambulatory Visit: Payer: Self-pay | Admitting: Internal Medicine

## 2024-01-05 DIAGNOSIS — I10 Essential (primary) hypertension: Secondary | ICD-10-CM

## 2024-01-05 DIAGNOSIS — N529 Male erectile dysfunction, unspecified: Secondary | ICD-10-CM

## 2024-01-05 DIAGNOSIS — M48062 Spinal stenosis, lumbar region with neurogenic claudication: Secondary | ICD-10-CM

## 2024-01-05 DIAGNOSIS — M5442 Lumbago with sciatica, left side: Secondary | ICD-10-CM

## 2024-01-05 MED ORDER — LOSARTAN POTASSIUM-HCTZ 50-12.5 MG PO TABS: 1.0000 | ORAL_TABLET | Freq: Every day | ORAL | 1 refills | Status: DC

## 2024-01-05 MED ORDER — TRAMADOL HCL 50 MG PO TABS: 50.0000 mg | ORAL_TABLET | Freq: Two times a day (BID) | ORAL | 0 refills | Status: DC | PRN

## 2024-01-05 MED ORDER — SILDENAFIL CITRATE 50 MG PO TABS: 50.0000 mg | ORAL_TABLET | ORAL | 1 refills | Status: DC | PRN

## 2024-01-05 MED ORDER — CYCLOBENZAPRINE HCL 5 MG PO TABS: 5.0000 mg | ORAL_TABLET | Freq: Two times a day (BID) | ORAL | 1 refills | Status: DC | PRN

## 2024-01-05 MED ORDER — GABAPENTIN 300 MG PO CAPS: ORAL_CAPSULE | ORAL | 3 refills | Status: DC

## 2024-01-05 NOTE — Telephone Encounter (Signed)
 Copied from CRM [id number]. Topic: Clinical - Medication Refill >> Jan 05, 2024  8:14 AM Turkey A wrote: Medication: cyclobenzaprine  (FLEXERIL ) 5 MG tablet; gabapentin  (NEURONTIN ) 300 MG capsule;losartan -hydrochlorothiazide (HYZAAR) 50-12.5 MG tablet;sildenafil  (VIAGRA ) 50 MG tablet;traMADol  (ULTRAM ) 50 MG tablet  Has the patient contacted their pharmacy? No (Agent: If no, request that the patient contact the pharmacy for the refill. If patient does not wish to contact the pharmacy document the reason why and proceed with request.) (Agent: If yes, when and what did the pharmacy advise?)  This is the patient's preferred pharmacy:  [id number] [place] [place] - [address] [facility] [address] [place] [address] Phone: [phone] Fax: [phone]  Is this the correct pharmacy for this prescription? Yes If no, delete pharmacy and type the correct one.   Has the prescription been filled recently? No  Is the patient out of the medication? No  Has the patient been seen for an appointment in the last year OR does the patient have an upcoming appointment? Yes  Can we respond through MyChart? Yes  Agent: Please be advised that Rx refills may take up to 3 business days. We ask that you follow-up with your pharmacy.

## 2024-02-03 ENCOUNTER — Telehealth: Payer: Self-pay | Admitting: Internal Medicine

## 2024-02-23 ENCOUNTER — Ambulatory Visit: Payer: BC Managed Care – PPO | Admitting: Internal Medicine

## 2024-03-31 ENCOUNTER — Encounter: Payer: Self-pay | Admitting: Internal Medicine

## 2024-03-31 ENCOUNTER — Ambulatory Visit (INDEPENDENT_AMBULATORY_CARE_PROVIDER_SITE_OTHER): Admitting: Internal Medicine

## 2024-03-31 VITALS — BP 134/74 | HR 61 | Ht 72.0 in | Wt 197.8 lb

## 2024-03-31 DIAGNOSIS — E782 Mixed hyperlipidemia: Secondary | ICD-10-CM | POA: Diagnosis not present

## 2024-03-31 DIAGNOSIS — M48062 Spinal stenosis, lumbar region with neurogenic claudication: Secondary | ICD-10-CM | POA: Diagnosis not present

## 2024-03-31 DIAGNOSIS — N529 Male erectile dysfunction, unspecified: Secondary | ICD-10-CM

## 2024-03-31 DIAGNOSIS — I10 Essential (primary) hypertension: Secondary | ICD-10-CM | POA: Diagnosis not present

## 2024-03-31 DIAGNOSIS — J309 Allergic rhinitis, unspecified: Secondary | ICD-10-CM

## 2024-03-31 DIAGNOSIS — E559 Vitamin D deficiency, unspecified: Secondary | ICD-10-CM | POA: Diagnosis not present

## 2024-03-31 DIAGNOSIS — Z125 Encounter for screening for malignant neoplasm of prostate: Secondary | ICD-10-CM | POA: Diagnosis not present

## 2024-03-31 DIAGNOSIS — R7303 Prediabetes: Secondary | ICD-10-CM | POA: Diagnosis not present

## 2024-03-31 MED ORDER — DESLORATADINE 5 MG PO TABS: 5.0000 mg | ORAL_TABLET | Freq: Every day | ORAL | 5 refills | Status: AC

## 2024-03-31 MED ORDER — FLUTICASONE PROPIONATE 50 MCG/ACT NA SUSP: 2.0000 | Freq: Every day | NASAL | 1 refills | Status: DC

## 2024-03-31 MED ORDER — SILDENAFIL CITRATE 50 MG PO TABS: 50.0000 mg | ORAL_TABLET | ORAL | 1 refills | Status: DC | PRN

## 2024-03-31 NOTE — Assessment & Plan Note (Signed)
 Chronic, intermittent nasal congestion and sinus pressure related headache likely due to allergic sinusitis Prescribed Clarinex  5 mg QD Advised to use Flonase  for nasal congestion/allergies

## 2024-03-31 NOTE — Assessment & Plan Note (Signed)
 Has Viagra  PRN, refilled

## 2024-03-31 NOTE — Assessment & Plan Note (Addendum)
 BP Readings from Last 1 Encounters:  03/31/24 134/74   Usually well-controlled with Losartan -hydrochlorothiazide 50-12.5 mg QD Counseled for compliance with the medications Advised DASH diet and moderate exercise/walking, at least 150 mins/week

## 2024-03-31 NOTE — Assessment & Plan Note (Signed)
 On Gabapentin  to 300 mg QAM and 600 mg QHS Advised to avoid heavy lifting and frequent bending - advised to bring paperwork/forms from workplace for light duty work Flexeril  as needed for muscle spasms Advised to take naproxen  500 mg twice daily as needed for pain, can alternate with Tylenol  arthritis Tramadol  as needed for severe pain only Needs spine surgery evaluation-but he prefers to avoid surgery

## 2024-03-31 NOTE — Patient Instructions (Addendum)
 Please start taking Clarinex  as prescribed for allergies. Please use Flonase  for nasal congestion.  Please continue to take medications as prescribed.  Please continue to follow low carb diet and perform moderate exercise/walking at least 150 mins/week.

## 2024-03-31 NOTE — Progress Notes (Signed)
 Established Patient Office Visit  Subjective:  Patient ID: Gregory Marsh, male    DOB: 1965/08/12  Age: 58 y.o. MRN: [id number]  CC:  Chief Complaint  Patient presents with   Hypertension    6 month f/u    Sinus Problem    Would like allergy medication prescribed , sinus acting up.     HPI Gregory Marsh is a 58 y.o. male with past medical history of HTN and lumbar spinal stenosis who presents for f/u of his chronic medical conditions.  HTN: BP is well-controlled. Takes medications regularly. Patient denies headache, dizziness, chest pain, dyspnea or palpitations.   He continues to have chronic low back pain, which radiates to his left LE. He admits that he has to lift heavy weights at work and has to bend multiple times to move heavy objects, which has worsened his pain in the past.  He has history of lumbar spinal stenosis. He takes Gabapentin  300 mg QAM and 600 mg QPM for it.  He also has Flexeril , which he takes occasionally for back muscle spasms.  He denies any saddle anesthesia, urinary or stool incontinence currently.    He had adequate response with sildenafil  50 mg for erectile dysfunction, requests refill. Denies dysuria, hematuria or urinary hesitancy.  He reports intermittent nasal congestion, sinus pressure related headache and sore throat.  He reports worsening of nasal congestion after mowing his grass.  Denies any dyspnea or wheezing currently.  Denies any fever or chills.    Past Medical History:  Diagnosis Date   AC separation, type 3, left, sequela 12/10/2011   Articular cartilage disorder, shoulder region 01/13/2012   Back pain    HTN (hypertension)    Primary localized osteoarthrosis, shoulder region 01/13/2012    Past Surgical History:  Procedure Laterality Date   COLONOSCOPY WITH PROPOFOL  N/A 06/04/2023   Procedure: COLONOSCOPY WITH PROPOFOL ;  Surgeon: Shaaron Lamar HERO, MD;  Location: [facility];  Service: Endoscopy;  Laterality: N/A;  7:30 am, asa 2    POLYPECTOMY  06/04/2023   Procedure: POLYPECTOMY;  Surgeon: Shaaron Lamar HERO, MD;  Location: [facility];  Service: Endoscopy;;   SHOULDER ARTHROSCOPY  01/09/2012   Procedure: ARTHROSCOPY SHOULDER;  Surgeon: Taft FORBES Minerva, MD;  Location: [facility];  Service: Orthopedics;  Laterality: Left;  with debridement   SHOULDER OPEN ROTATOR CUFF REPAIR  01/09/2012   Procedure: ROTATOR CUFF REPAIR SHOULDER OPEN;  Surgeon: Taft FORBES Minerva, MD;  Location: [facility];  Service: Orthopedics;  Laterality: Left;  with distal clavicle excision    Family History  Problem Relation Age of Onset   Diabetes Mother    Diabetes Other    Anesthesia problems Neg Hx    Hypotension Neg Hx    Malignant hyperthermia Neg Hx    Pseudochol deficiency Neg Hx    Colon cancer Neg Hx     Social History   Socioeconomic History   Marital status: Married    Spouse name: Not on file   Number of children: Not on file   Years of education: 12   Highest education level: Not on file  Occupational History   Occupation: Emergency planning/management officer  Tobacco Use   Smoking status: Light Smoker    Types: Cigars   Smokeless tobacco: Never  Vaping Use   Vaping status: Never Used  Substance and Sexual Activity   Alcohol use: Yes    Alcohol/week: 6.0 standard drinks of alcohol    Types: 6 Cans of beer per  week    Comment: occasionally   Drug use: Yes    Frequency: 2.0 times per week    Types: Marijuana   Sexual activity: Yes    Birth control/protection: None  Other Topics Concern   Not on file  Social History Narrative   Not on file   Social Drivers of Health   Financial Resource Strain: Not on file  Food Insecurity: Not on file  Transportation Needs: Not on file  Physical Activity: Not on file  Stress: Not on file  Social Connections: Not on file  Intimate Partner Violence: Not on file    Outpatient Medications Prior to Visit  Medication Sig Dispense Refill   cyclobenzaprine  (FLEXERIL ) 5 MG tablet Take 1 tablet (5 mg total) by  mouth 2 (two) times daily as needed for muscle spasms. 30 tablet 1   gabapentin  (NEURONTIN ) 300 MG capsule Take 1 capsule (300 mg total) by mouth in the morning AND 2 capsules (600 mg total) at bedtime. 90 capsule 3   losartan -hydrochlorothiazide (HYZAAR) 50-12.5 MG tablet Take 1 tablet by mouth daily. 90 tablet 1   naproxen  (NAPROSYN ) 500 MG tablet Take 1 tablet (500 mg total) by mouth 2 (two) times daily with a meal. 60 tablet 5   traMADol  (ULTRAM ) 50 MG tablet Take 1 tablet (50 mg total) by mouth every 12 (twelve) hours as needed. 20 tablet 0   triamcinolone  cream (KENALOG ) 0.1 % APPLY TO AFFECTED AREA TWICE A DAY 30 g 0   sildenafil  (VIAGRA ) 50 MG tablet Take 1 tablet (50 mg total) by mouth as needed for erectile dysfunction. 30 tablet 1   No facility-administered medications prior to visit.    No Known Allergies  ROS Review of Systems  Constitutional:  Negative for chills and fever.  HENT:  Positive for congestion, postnasal drip and sinus pressure. Negative for sore throat.   Eyes:  Negative for pain and discharge.  Respiratory:  Negative for cough and shortness of breath.   Cardiovascular:  Negative for chest pain and palpitations.  Gastrointestinal:  Negative for diarrhea, nausea and vomiting.  Endocrine: Negative for polydipsia and polyuria.  Genitourinary:  Negative for dysuria and hematuria.  Musculoskeletal:  Positive for arthralgias (B/l wrist and thumb) and back pain. Negative for neck pain and neck stiffness.  Skin:  Negative for rash.  Neurological:  Negative for dizziness, weakness, numbness and headaches.  Psychiatric/Behavioral:  Negative for agitation and behavioral problems.       Objective:    Physical Exam Vitals reviewed.  Constitutional:      General: He is not in acute distress.    Appearance: He is not diaphoretic.  HENT:     Head: Normocephalic and atraumatic.     Nose: Congestion present.     Mouth/Throat:     Mouth: Mucous membranes are moist.   Eyes:     General: No scleral icterus.    Extraocular Movements: Extraocular movements intact.  Cardiovascular:     Rate and Rhythm: Normal rate and regular rhythm.     Heart sounds: Normal heart sounds. No murmur heard. Pulmonary:     Breath sounds: Normal breath sounds. No wheezing or rales.  Musculoskeletal:        General: Tenderness (Lumbar spine area) present.     Cervical back: Neck supple. No tenderness.     Right lower leg: No edema.     Left lower leg: No edema.  Skin:    General: Skin is warm.  Findings: No rash.  Neurological:     General: No focal deficit present.     Mental Status: He is alert and oriented to person, place, and time.  Psychiatric:        Mood and Affect: Mood normal.        Behavior: Behavior normal.     BP 134/74 (BP Location: Left Arm)   Pulse 61   Ht 6' (1.829 m)   Wt 197 lb 12.8 oz (89.7 kg)   SpO2 99%   BMI 26.83 kg/m  Wt Readings from Last 3 Encounters:  03/31/24 197 lb 12.8 oz (89.7 kg)  11/30/23 213 lb 9.6 oz (96.9 kg)  08/26/23 205 lb (93 kg)    Lab Results  Component Value Date   TSH 3.230 01/09/2023   Lab Results  Component Value Date   WBC 5.3 01/09/2023   HGB 12.9 (L) 01/09/2023   HCT 39.4 01/09/2023   MCV 96 01/09/2023   PLT 257 01/09/2023   Lab Results  Component Value Date   NA 139 05/25/2023   K 4.1 05/25/2023   CO2 28 05/25/2023   GLUCOSE 117 (H) 05/25/2023   BUN 12 05/25/2023   CREATININE 0.95 05/25/2023   BILITOT 0.3 01/09/2023   ALKPHOS 60 01/09/2023   AST 28 01/09/2023   ALT 30 01/09/2023   PROT 7.0 01/09/2023   ALBUMIN 4.3 01/09/2023   CALCIUM 8.8 (L) 05/25/2023   ANIONGAP 8 05/25/2023   EGFR 97 01/09/2023   Lab Results  Component Value Date   CHOL 196 01/09/2023   Lab Results  Component Value Date   HDL 73 01/09/2023   Lab Results  Component Value Date   LDLCALC 109 (H) 01/09/2023   Lab Results  Component Value Date   TRIG 79 01/09/2023   Lab Results  Component Value Date    CHOLHDL 2.7 01/09/2023   Lab Results  Component Value Date   HGBA1C 6.3 (H) 01/09/2023      Assessment & Plan:   Problem List Items Addressed This Visit       Cardiovascular and Mediastinum   Essential hypertension - Primary   BP Readings from Last 1 Encounters:  03/31/24 134/74   Usually well-controlled with Losartan -hydrochlorothiazide 50-12.5 mg QD Counseled for compliance with the medications Advised DASH diet and moderate exercise/walking, at least 150 mins/week      Relevant Medications   sildenafil  (VIAGRA ) 50 MG tablet     Respiratory   Allergic sinusitis   Chronic, intermittent nasal congestion and sinus pressure related headache likely due to allergic sinusitis Prescribed Clarinex  5 mg QD Advised to use Flonase  for nasal congestion/allergies      Relevant Medications   fluticasone  (FLONASE ) 50 MCG/ACT nasal spray   desloratadine  (CLARINEX ) 5 MG tablet     Other   Spinal stenosis of lumbar region (Chronic)   On Gabapentin  to 300 mg QAM and 600 mg QHS Advised to avoid heavy lifting and frequent bending - advised to bring paperwork/forms from workplace for light duty work Flexeril  as needed for muscle spasms Advised to take naproxen  500 mg twice daily as needed for pain, can alternate with Tylenol  arthritis Tramadol  as needed for severe pain only Needs spine surgery evaluation-but he prefers to avoid surgery      Erectile dysfunction   Has Viagra  PRN, refilled      Relevant Medications   sildenafil  (VIAGRA ) 50 MG tablet    Denies Pneumococcal vaccine for now.  Meds ordered this encounter  Medications  fluticasone  (FLONASE ) 50 MCG/ACT nasal spray    Sig: Place 2 sprays into both nostrils daily.    Dispense:  16 g    Refill:  1   desloratadine  (CLARINEX ) 5 MG tablet    Sig: Take 1 tablet (5 mg total) by mouth daily.    Dispense:  30 tablet    Refill:  5   sildenafil  (VIAGRA ) 50 MG tablet    Sig: Take 1 tablet (50 mg total) by mouth as  needed for erectile dysfunction.    Dispense:  30 tablet    Refill:  1    Follow-up: Return in about 6 months (around 10/01/2024) for Annual physical.    Suzzane MARLA Blanch, MD

## 2024-04-01 ENCOUNTER — Telehealth: Payer: Self-pay

## 2024-04-01 ENCOUNTER — Encounter: Payer: Self-pay | Admitting: Radiology

## 2024-04-01 ENCOUNTER — Ambulatory Visit: Payer: Self-pay | Admitting: Internal Medicine

## 2024-04-01 LAB — VITAMIN D 25 HYDROXY (VIT D DEFICIENCY, FRACTURES): Vit D, 25-Hydroxy: 28.3 ng/mL — ABNORMAL LOW (ref 30.0–100.0)

## 2024-04-01 LAB — CMP14+EGFR
ALT: 65 IU/L — ABNORMAL HIGH (ref 0–44)
AST: 57 IU/L — ABNORMAL HIGH (ref 0–40)
Albumin: 4.3 g/dL (ref 3.8–4.9)
Alkaline Phosphatase: 61 IU/L (ref 44–121)
BUN/Creatinine Ratio: 14 (ref 9–20)
BUN: 13 mg/dL (ref 6–24)
Bilirubin Total: 0.2 mg/dL (ref 0.0–1.2)
CO2: 23 mmol/L (ref 20–29)
Calcium: 9.3 mg/dL (ref 8.7–10.2)
Chloride: 101 mmol/L (ref 96–106)
Creatinine, Ser: 0.93 mg/dL (ref 0.76–1.27)
Globulin, Total: 2.8 g/dL (ref 1.5–4.5)
Glucose: 111 mg/dL — ABNORMAL HIGH (ref 70–99)
Potassium: 4.7 mmol/L (ref 3.5–5.2)
Sodium: 140 mmol/L (ref 134–144)
Total Protein: 7.1 g/dL (ref 6.0–8.5)
eGFR: 95 mL/min/1.73 (ref >59)

## 2024-04-01 LAB — CBC WITH DIFFERENTIAL/PLATELET
Basophils Absolute: 0.1 x10E3/uL (ref 0.0–0.2)
Basos: 1 %
EOS (ABSOLUTE): 0.2 x10E3/uL (ref 0.0–0.4)
Eos: 4 %
Hematocrit: 39.6 % (ref 37.5–51.0)
Hemoglobin: 13.1 g/dL (ref 13.0–17.7)
Immature Grans (Abs): 0 x10E3/uL (ref 0.0–0.1)
Immature Granulocytes: 0 %
Lymphocytes Absolute: 2.5 x10E3/uL (ref 0.7–3.1)
Lymphs: 49 %
MCH: 33.2 pg — ABNORMAL HIGH (ref 26.6–33.0)
MCHC: 33.1 g/dL (ref 31.5–35.7)
MCV: 100 fL — ABNORMAL HIGH (ref 79–97)
Monocytes Absolute: 0.7 x10E3/uL (ref 0.1–0.9)
Monocytes: 13 %
Neutrophils Absolute: 1.7 x10E3/uL (ref 1.4–7.0)
Neutrophils: 33 %
Platelets: 246 x10E3/uL (ref 150–450)
RBC: 3.95 x10E6/uL — ABNORMAL LOW (ref 4.14–5.80)
RDW: 12.9 % (ref 11.6–15.4)
WBC: 5.2 x10E3/uL (ref 3.4–10.8)

## 2024-04-01 LAB — HEMOGLOBIN A1C
Est. average glucose Bld gHb Est-mCnc: 140 mg/dL
Hgb A1c MFr Bld: 6.5 % — ABNORMAL HIGH (ref 4.8–5.6)

## 2024-04-01 LAB — TSH: TSH: 2 u[IU]/mL (ref 0.450–4.500)

## 2024-04-01 LAB — LIPID PANEL
Chol/HDL Ratio: 2.6 ratio (ref 0.0–5.0)
Cholesterol, Total: 193 mg/dL (ref 100–199)
HDL: 75 mg/dL (ref >39)
LDL Chol Calc (NIH): 107 mg/dL — ABNORMAL HIGH (ref 0–99)
Triglycerides: 57 mg/dL (ref 0–149)
VLDL Cholesterol Cal: 11 mg/dL (ref 5–40)

## 2024-04-01 LAB — PSA: Prostate Specific Ag, Serum: 1.4 ng/mL (ref 0.0–4.0)

## 2024-04-01 NOTE — Telephone Encounter (Signed)
Pt informed

## 2024-04-01 NOTE — Telephone Encounter (Signed)
 Copied from CRM [id number]. Topic: Clinical - Lab/Test Results >> Apr 01, 2024  8:47 AM Everette C wrote: Reason for CRM: The patient has returned missed phone call from DOROTHA Heck CMA and would like to speak again when possible regarding their results

## 2024-06-06 ENCOUNTER — Telehealth: Payer: Self-pay | Admitting: Internal Medicine

## 2024-06-06 DIAGNOSIS — M48062 Spinal stenosis, lumbar region with neurogenic claudication: Secondary | ICD-10-CM

## 2024-06-06 DIAGNOSIS — J309 Allergic rhinitis, unspecified: Secondary | ICD-10-CM

## 2024-06-06 DIAGNOSIS — N529 Male erectile dysfunction, unspecified: Secondary | ICD-10-CM

## 2024-06-06 MED ORDER — CYCLOBENZAPRINE HCL 5 MG PO TABS: 5.0000 mg | ORAL_TABLET | Freq: Two times a day (BID) | ORAL | 1 refills | Status: DC | PRN

## 2024-06-06 MED ORDER — FLUTICASONE PROPIONATE 50 MCG/ACT NA SUSP
2.0000 | Freq: Every day | NASAL | 1 refills | Status: AC
Start: 2024-06-06 — End: ?

## 2024-06-06 MED ORDER — SILDENAFIL CITRATE 50 MG PO TABS: 50.0000 mg | ORAL_TABLET | ORAL | 1 refills | Status: DC | PRN

## 2024-06-06 MED ORDER — GABAPENTIN 300 MG PO CAPS: ORAL_CAPSULE | ORAL | 3 refills | Status: AC

## 2024-06-06 NOTE — Telephone Encounter (Signed)
 Copied from CRM [id number]. Topic: Clinical - Medication Refill >> Jun 06, 2024 11:20 AM Winona R wrote: Medication: sildenafil  (VIAGRA ) 50 MG tablet,  gabapentin  (NEURONTIN ) 300 MG capsule cyclobenzaprine  (FLEXERIL ) 5 MG tablet [[id number]]- completely out  fluticasone  (FLONASE ) 50 MCG/ACT nasal spray [[id number]]  Has the patient contacted their pharmacy? Yes (Agent: If no, request that the patient contact the pharmacy for the refill. If patient does not wish to contact the pharmacy document the reason why and proceed with request.) (Agent: If yes, when and what did the pharmacy advise?)  This is the patient's preferred pharmacy:  [id number] [place], [place] - [address] AT [facility] [address] [place] [address] Phone: [phone] Fax: [phone]  Is this the correct pharmacy for this prescription? Yes If no, delete pharmacy and type the correct one.   Has the prescription been filled recently? No  Is the patient out of the medication? Yes  Has the patient been seen for an appointment in the last year OR does the patient have an upcoming appointment? Yes  Can we respond through MyChart? Yes  Agent: Please be advised that Rx refills may take up to 3 business days. We ask that you follow-up with your pharmacy.

## 2024-06-13 ENCOUNTER — Encounter: Payer: Self-pay | Admitting: Radiology

## 2024-06-28 ENCOUNTER — Ambulatory Visit: Admitting: Internal Medicine

## 2024-07-04 ENCOUNTER — Ambulatory Visit: Admitting: Internal Medicine

## 2024-08-03 ENCOUNTER — Ambulatory Visit: Admitting: Internal Medicine

## 2024-08-03 ENCOUNTER — Encounter: Payer: Self-pay | Admitting: Internal Medicine

## 2024-08-03 VITALS — BP 138/80 | HR 67 | Ht 72.0 in | Wt 205.7 lb

## 2024-08-03 DIAGNOSIS — R7401 Elevation of levels of liver transaminase levels: Secondary | ICD-10-CM

## 2024-08-03 DIAGNOSIS — M48062 Spinal stenosis, lumbar region with neurogenic claudication: Secondary | ICD-10-CM

## 2024-08-03 DIAGNOSIS — E782 Mixed hyperlipidemia: Secondary | ICD-10-CM | POA: Diagnosis not present

## 2024-08-03 DIAGNOSIS — I10 Essential (primary) hypertension: Secondary | ICD-10-CM | POA: Diagnosis not present

## 2024-08-03 DIAGNOSIS — R7303 Prediabetes: Secondary | ICD-10-CM | POA: Diagnosis not present

## 2024-08-03 DIAGNOSIS — Z23 Encounter for immunization: Secondary | ICD-10-CM | POA: Diagnosis not present

## 2024-08-03 DIAGNOSIS — E1169 Type 2 diabetes mellitus with other specified complication: Secondary | ICD-10-CM

## 2024-08-03 MED ORDER — LOSARTAN POTASSIUM-HCTZ 50-12.5 MG PO TABS: 1.0000 | ORAL_TABLET | Freq: Every day | ORAL | 1 refills | Status: AC

## 2024-08-03 NOTE — Assessment & Plan Note (Signed)
 Likely due to alcohol intake Reports taking 2-3 alcoholic drinks every other day also, advised to cut down to maximum 2 drinks in a day Maintain at least 64 ounces of water  intake in a day Likely some component of MAFLD as well, needs to follow low-carb diet

## 2024-08-03 NOTE — Assessment & Plan Note (Signed)
 On Gabapentin  to 300 mg QAM and 600 mg QHS Advised to avoid heavy lifting and frequent bending Flexeril  as needed for muscle spasms Advised to take naproxen  500 mg twice daily as needed for pain, can alternate with Tylenol  arthritis Tramadol  as needed for severe pain only Needs spine surgery evaluation-but he prefers to avoid surgery

## 2024-08-03 NOTE — Progress Notes (Signed)
 "  Established Patient Office Visit  Subjective:  Patient ID: Gregory Marsh, male    DOB: 07/05/1966  Age: 58 y.o. MRN: [id number]  CC:  Chief Complaint  Patient presents with   Diabetes    3 month f/u     HPI Gregory Marsh is a 58 y.o. male with past medical history of HTN and lumbar spinal stenosis who presents for f/u of his chronic medical conditions.  HTN: BP is well-controlled. Takes medications regularly. Patient denies headache, dizziness, chest pain, dyspnea or palpitations.  Type II DM: His last HbA1c was 6.5 in 08/25.  He has cut down sweet intake since then.  He has been trying to avoid red meat as well.  He agrees to continue to follow low-carb diet to avoid medicine for now.   His last CMP also showed elevated liver enzymes, and reports that he takes 2-3 alcoholic drinks every other day or so.  He agrees to cut down alcohol intake.  He continues to have chronic low back pain, which radiates to his left LE. He admits that he has to lift heavy weights at work and has to bend multiple times to move heavy objects, which has worsened his pain in the past.  He has history of lumbar spinal stenosis. He takes Gabapentin  300 mg QAM and 600 mg QPM for it.  He also has Flexeril , which he takes occasionally for back muscle spasms.  He denies any saddle anesthesia, urinary or stool incontinence currently.    He had adequate response with sildenafil  50 mg for erectile dysfunction, requests refill. Denies dysuria, hematuria or urinary hesitancy.    Past Medical History:  Diagnosis Date   AC separation, type 3, left, sequela 12/10/2011   Articular cartilage disorder, shoulder region 01/13/2012   Back pain    HTN (hypertension)    Primary localized osteoarthrosis, shoulder region 01/13/2012    Past Surgical History:  Procedure Laterality Date   COLONOSCOPY WITH PROPOFOL  N/A 06/04/2023   Procedure: COLONOSCOPY WITH PROPOFOL ;  Surgeon: Shaaron Lamar HERO, MD;  Location: [facility];   Service: Endoscopy;  Laterality: N/A;  7:30 am, asa 2   POLYPECTOMY  06/04/2023   Procedure: POLYPECTOMY;  Surgeon: Shaaron Lamar HERO, MD;  Location: [facility];  Service: Endoscopy;;   SHOULDER ARTHROSCOPY  01/09/2012   Procedure: ARTHROSCOPY SHOULDER;  Surgeon: Taft FORBES Minerva, MD;  Location: [facility];  Service: Orthopedics;  Laterality: Left;  with debridement   SHOULDER OPEN ROTATOR CUFF REPAIR  01/09/2012   Procedure: ROTATOR CUFF REPAIR SHOULDER OPEN;  Surgeon: Taft FORBES Minerva, MD;  Location: [facility];  Service: Orthopedics;  Laterality: Left;  with distal clavicle excision    Family History  Problem Relation Age of Onset   Diabetes Mother    Diabetes Other    Anesthesia problems Neg Hx    Hypotension Neg Hx    Malignant hyperthermia Neg Hx    Pseudochol deficiency Neg Hx    Colon cancer Neg Hx     Social History   Socioeconomic History   Marital status: Married    Spouse name: Not on file   Number of children: Not on file   Years of education: 12   Highest education level: Not on file  Occupational History   Occupation: emergency planning/management officer  Tobacco Use   Smoking status: Light Smoker    Types: Cigars   Smokeless tobacco: Never  Vaping Use   Vaping status: Never Used  Substance and Sexual Activity  Alcohol use: Yes    Alcohol/week: 6.0 standard drinks of alcohol    Types: 6 Cans of beer per week    Comment: occasionally   Drug use: Yes    Frequency: 2.0 times per week    Types: Marijuana   Sexual activity: Yes    Birth control/protection: None  Other Topics Concern   Not on file  Social History Narrative   Not on file   Social Drivers of Health   Tobacco Use: High Risk (08/03/2024)   Patient History    Smoking Tobacco Use: Light Smoker    Smokeless Tobacco Use: Never    Passive Exposure: Not on file  Financial Resource Strain: Not on file  Food Insecurity: Not on file  Transportation Needs: Not on file  Physical Activity: Not on file  Stress: Not on file   Social Connections: Not on file  Intimate Partner Violence: Not on file  Depression (PHQ2-9): Low Risk (08/03/2024)   Depression (PHQ2-9)    PHQ-2 Score: 0  Alcohol Screen: Not on file  Housing: Not on file  Utilities: Not on file  Health Literacy: Not on file    Outpatient Medications Prior to Visit  Medication Sig Dispense Refill   cyclobenzaprine  (FLEXERIL ) 5 MG tablet Take 1 tablet (5 mg total) by mouth 2 (two) times daily as needed for muscle spasms. 30 tablet 1   desloratadine  (CLARINEX ) 5 MG tablet Take 1 tablet (5 mg total) by mouth daily. 30 tablet 5   fluticasone  (FLONASE ) 50 MCG/ACT nasal spray Place 2 sprays into both nostrils daily. 16 g 1   gabapentin  (NEURONTIN ) 300 MG capsule Take 1 capsule (300 mg total) by mouth in the morning AND 2 capsules (600 mg total) at bedtime. 90 capsule 3   naproxen  (NAPROSYN ) 500 MG tablet Take 1 tablet (500 mg total) by mouth 2 (two) times daily with a meal. 60 tablet 5   sildenafil  (VIAGRA ) 50 MG tablet Take 1 tablet (50 mg total) by mouth as needed for erectile dysfunction. 30 tablet 1   traMADol  (ULTRAM ) 50 MG tablet Take 1 tablet (50 mg total) by mouth every 12 (twelve) hours as needed. 20 tablet 0   triamcinolone  cream (KENALOG ) 0.1 % APPLY TO AFFECTED AREA TWICE A DAY 30 g 0   losartan -hydrochlorothiazide (HYZAAR) 50-12.5 MG tablet Take 1 tablet by mouth daily. 90 tablet 1   No facility-administered medications prior to visit.    No Known Allergies  ROS Review of Systems  Constitutional:  Negative for chills and fever.  HENT:  Positive for congestion. Negative for sinus pressure and sore throat.   Eyes:  Negative for pain and discharge.  Respiratory:  Negative for cough and shortness of breath.   Cardiovascular:  Negative for chest pain and palpitations.  Gastrointestinal:  Negative for diarrhea, nausea and vomiting.  Endocrine: Negative for polydipsia and polyuria.  Genitourinary:  Negative for dysuria and hematuria.   Musculoskeletal:  Positive for arthralgias (B/l wrist and thumb) and back pain. Negative for neck pain and neck stiffness.  Skin:  Negative for rash.  Neurological:  Negative for dizziness, weakness, numbness and headaches.  Psychiatric/Behavioral:  Negative for agitation and behavioral problems.       Objective:    Physical Exam Vitals reviewed.  Constitutional:      General: He is not in acute distress.    Appearance: He is not diaphoretic.  HENT:     Head: Normocephalic and atraumatic.     Nose: Congestion present.  Mouth/Throat:     Mouth: Mucous membranes are moist.  Eyes:     General: No scleral icterus.    Extraocular Movements: Extraocular movements intact.  Cardiovascular:     Rate and Rhythm: Normal rate and regular rhythm.     Heart sounds: Normal heart sounds. No murmur heard. Pulmonary:     Breath sounds: Normal breath sounds. No wheezing or rales.  Musculoskeletal:        General: Tenderness (Lumbar spine area) present.     Cervical back: Neck supple. No tenderness.     Right lower leg: No edema.     Left lower leg: No edema.  Skin:    General: Skin is warm.     Findings: No rash.  Neurological:     General: No focal deficit present.     Mental Status: He is alert and oriented to person, place, and time.  Psychiatric:        Mood and Affect: Mood normal.        Behavior: Behavior normal.     BP 138/80 (BP Location: Left Arm)   Pulse 67   Ht 6' (1.829 m)   Wt 205 lb 10.4 oz (93.3 kg)   SpO2 99%   BMI 27.89 kg/m  Wt Readings from Last 3 Encounters:  08/03/24 205 lb 10.4 oz (93.3 kg)  03/31/24 197 lb 12.8 oz (89.7 kg)  11/30/23 213 lb 9.6 oz (96.9 kg)    Lab Results  Component Value Date   TSH 2.000 03/31/2024   Lab Results  Component Value Date   WBC 5.2 03/31/2024   HGB 13.1 03/31/2024   HCT 39.6 03/31/2024   MCV 100 (H) 03/31/2024   PLT 246 03/31/2024   Lab Results  Component Value Date   NA 140 03/31/2024   K 4.7 03/31/2024    CO2 23 03/31/2024   GLUCOSE 111 (H) 03/31/2024   BUN 13 03/31/2024   CREATININE 0.93 03/31/2024   BILITOT 0.2 03/31/2024   ALKPHOS 61 03/31/2024   AST 57 (H) 03/31/2024   ALT 65 (H) 03/31/2024   PROT 7.1 03/31/2024   ALBUMIN 4.3 03/31/2024   CALCIUM 9.3 03/31/2024   ANIONGAP 8 05/25/2023   EGFR 95 03/31/2024   Lab Results  Component Value Date   CHOL 193 03/31/2024   Lab Results  Component Value Date   HDL 75 03/31/2024   Lab Results  Component Value Date   LDLCALC 107 (H) 03/31/2024   Lab Results  Component Value Date   TRIG 57 03/31/2024   Lab Results  Component Value Date   CHOLHDL 2.6 03/31/2024   Lab Results  Component Value Date   HGBA1C 6.5 (H) 03/31/2024      Assessment & Plan:   Problem List Items Addressed This Visit       Cardiovascular and Mediastinum   Essential hypertension - Primary   BP Readings from Last 1 Encounters:  08/03/24 138/80   Usually well-controlled with Losartan -hydrochlorothiazide 50-12.5 mg QD Counseled for compliance with the medications Advised DASH diet and moderate exercise/walking, at least 150 mins/week      Relevant Medications   losartan -hydrochlorothiazide (HYZAAR) 50-12.5 MG tablet     Endocrine   Type 2 diabetes mellitus with other specified complication (HCC)   Lab Results  Component Value Date   HGBA1C 6.5 (H) 03/31/2024   New onset, recheck HbA1c today If elevated HbA1c, will consider starting metformin Advised to follow diabetic diet Last CMP showed elevated liver transaminases, would avoid  starting statin for now F/u CMP and lipid panel Diabetic eye exam: Advised to follow up with Ophthalmology for diabetic eye exam      Relevant Medications   losartan -hydrochlorothiazide (HYZAAR) 50-12.5 MG tablet   Other Relevant Orders   CMP14+EGFR   Hemoglobin A1c   Urine Microalbumin w/creat. ratio     Other   Spinal stenosis of lumbar region (Chronic)   On Gabapentin  to 300 mg QAM and 600 mg  QHS Advised to avoid heavy lifting and frequent bending Flexeril  as needed for muscle spasms Advised to take naproxen  500 mg twice daily as needed for pain, can alternate with Tylenol  arthritis Tramadol  as needed for severe pain only Needs spine surgery evaluation-but he prefers to avoid surgery      Mixed hyperlipidemia   Advised to follow low-cholesterol diet Last CMP showed elevated liver transaminases, would avoid starting statin for now - recheck CMP      Relevant Medications   losartan -hydrochlorothiazide (HYZAAR) 50-12.5 MG tablet   Elevated liver transaminase level   Likely due to alcohol intake Reports taking 2-3 alcoholic drinks every other day also, advised to cut down to maximum 2 drinks in a day Maintain at least 64 ounces of water  intake in a day Likely some component of MAFLD as well, needs to follow low-carb diet      Other Visit Diagnoses       Encounter for immunization       Relevant Orders   Flu vaccine trivalent PF, 6mos and older(Flulaval,Afluria,Fluarix,Fluzone) (Completed)        Denies Pneumococcal vaccine for now.  Meds ordered this encounter  Medications   losartan -hydrochlorothiazide (HYZAAR) 50-12.5 MG tablet    Sig: Take 1 tablet by mouth daily.    Dispense:  90 tablet    Refill:  1    Follow-up: Return in about 4 months (around 12/02/2024).    Suzzane MARLA Blanch, MD "

## 2024-08-03 NOTE — Assessment & Plan Note (Signed)
 Lab Results  Component Value Date   HGBA1C 6.5 (H) 03/31/2024   New onset, recheck HbA1c today If elevated HbA1c, will consider starting metformin Advised to follow diabetic diet Last CMP showed elevated liver transaminases, would avoid starting statin for now F/u CMP and lipid panel Diabetic eye exam: Advised to follow up with Ophthalmology for diabetic eye exam

## 2024-08-03 NOTE — Assessment & Plan Note (Signed)
 BP Readings from Last 1 Encounters:  08/03/24 138/80   Usually well-controlled with Losartan -hydrochlorothiazide 50-12.5 mg QD Counseled for compliance with the medications Advised DASH diet and moderate exercise/walking, at least 150 mins/week

## 2024-08-03 NOTE — Patient Instructions (Signed)
 Please continue to take medications as prescribed.  Please continue to follow low carb diet and perform moderate exercise/walking at least 150 mins/week.

## 2024-08-03 NOTE — Assessment & Plan Note (Signed)
 Advised to follow low-cholesterol diet Last CMP showed elevated liver transaminases, would avoid starting statin for now - recheck CMP

## 2024-08-04 LAB — CMP14+EGFR
ALT: 38 IU/L (ref 0–44)
AST: 40 IU/L (ref 0–40)
Albumin: 4.3 g/dL (ref 3.8–4.9)
Alkaline Phosphatase: 55 IU/L (ref 47–123)
BUN/Creatinine Ratio: 14 (ref 9–20)
BUN: 13 mg/dL (ref 6–24)
Bilirubin Total: 0.3 mg/dL (ref 0.0–1.2)
CO2: 25 mmol/L (ref 20–29)
Calcium: 9.5 mg/dL (ref 8.7–10.2)
Chloride: 99 mmol/L (ref 96–106)
Creatinine, Ser: 0.94 mg/dL (ref 0.76–1.27)
Globulin, Total: 2.9 g/dL (ref 1.5–4.5)
Glucose: 111 mg/dL — ABNORMAL HIGH (ref 70–99)
Potassium: 4 mmol/L (ref 3.5–5.2)
Sodium: 139 mmol/L (ref 134–144)
Total Protein: 7.2 g/dL (ref 6.0–8.5)
eGFR: 93 mL/min/1.73

## 2024-08-04 LAB — HEMOGLOBIN A1C
Est. average glucose Bld gHb Est-mCnc: 137 mg/dL
Hgb A1c MFr Bld: 6.4 % — ABNORMAL HIGH (ref 4.8–5.6)

## 2024-08-05 ENCOUNTER — Ambulatory Visit: Payer: Self-pay | Admitting: Internal Medicine

## 2024-08-08 DIAGNOSIS — R7303 Prediabetes: Secondary | ICD-10-CM | POA: Diagnosis not present

## 2024-08-10 LAB — MICROALBUMIN / CREATININE URINE RATIO
Creatinine, Urine: 173.4 mg/dL
Microalb/Creat Ratio: 6 mg/g{creat} (ref 0–29)
Microalbumin, Urine: 11.1 ug/mL

## 2024-09-06 ENCOUNTER — Telehealth: Payer: Self-pay

## 2024-09-06 ENCOUNTER — Other Ambulatory Visit: Payer: Self-pay

## 2024-09-06 DIAGNOSIS — M48062 Spinal stenosis, lumbar region with neurogenic claudication: Secondary | ICD-10-CM

## 2024-09-06 DIAGNOSIS — N529 Male erectile dysfunction, unspecified: Secondary | ICD-10-CM

## 2024-09-06 MED ORDER — CYCLOBENZAPRINE HCL 5 MG PO TABS: 5.0000 mg | ORAL_TABLET | Freq: Two times a day (BID) | ORAL | 0 refills | Status: AC | PRN

## 2024-09-06 MED ORDER — SILDENAFIL CITRATE 50 MG PO TABS: 50.0000 mg | ORAL_TABLET | ORAL | 1 refills | Status: AC | PRN

## 2024-09-06 NOTE — Telephone Encounter (Signed)
Flexeril has been refilled.

## 2024-09-06 NOTE — Addendum Note (Signed)
 Addended by: ANTONETTA ROLLENE BRAVO on: 09/06/2024 02:48 PM   Modules accepted: Orders

## 2024-09-06 NOTE — Telephone Encounter (Signed)
 Copied from CRM [id number]. Topic: Clinical - Medication Refill >> Sep 06, 2024 11:03 AM Sophia H wrote: Medication:  cyclobenzaprine  (FLEXERIL ) 5 MG tablet  triamcinolone  cream (KENALOG ) 0.1 %  traMADol  (ULTRAM ) 50 MG tablet  sildenafil  (VIAGRA ) 50 MG tablet  gabapentin  (NEURONTIN ) 300 MG capsule   Has the patient contacted their pharmacy? Yes, told he needs updated RX's.   This is the patient's preferred pharmacy:  [id number] - [place], [place] - [address] AT [facility] [address] [place] [address] Phone: [phone] Fax: [phone]  Is this the correct pharmacy for this prescription? Yes If no, delete pharmacy and type the correct one.   Has the prescription been filled recently? Yes, filled in last year.  Is the patient out of the medication? Completely out of Flexeril , almost out of everything else.   Has the patient been seen for an appointment in the last year OR does the patient have an upcoming appointment? Yes, appt scheduled for April 2026.   Can we respond through MyChart? Yes but prefers phone call.   Agent: Please be advised that Rx refills may take up to 3 business days. We ask that you follow-up with your pharmacy.

## 2024-09-09 ENCOUNTER — Other Ambulatory Visit: Payer: Self-pay | Admitting: Internal Medicine

## 2024-09-09 DIAGNOSIS — M5442 Lumbago with sciatica, left side: Secondary | ICD-10-CM

## 2024-09-09 DIAGNOSIS — L239 Allergic contact dermatitis, unspecified cause: Secondary | ICD-10-CM

## 2024-09-09 MED ORDER — TRIAMCINOLONE ACETONIDE 0.1 % EX CREA: TOPICAL_CREAM | Freq: Two times a day (BID) | CUTANEOUS | 0 refills | Status: AC

## 2024-09-09 MED ORDER — TRAMADOL HCL 50 MG PO TABS: 50.0000 mg | ORAL_TABLET | Freq: Two times a day (BID) | ORAL | 0 refills | Status: AC | PRN

## 2024-09-09 NOTE — Telephone Encounter (Signed)
 Pt informed

## 2024-10-13 ENCOUNTER — Encounter: Admitting: Internal Medicine

## 2024-12-05 ENCOUNTER — Encounter: Payer: Self-pay | Admitting: Internal Medicine
